# Patient Record
Sex: Male | Born: 1980 | Race: White | Hispanic: No | Marital: Married | Smoking: Former smoker
Health system: Southern US, Community
[De-identification: ages and names within clinical notes are randomized; demographics above are authoritative.]

## PROBLEM LIST (undated history)

## (undated) DIAGNOSIS — I209 Angina pectoris, unspecified: Secondary | ICD-10-CM

## (undated) DIAGNOSIS — I252 Old myocardial infarction: Secondary | ICD-10-CM

## (undated) DIAGNOSIS — I509 Heart failure, unspecified: Secondary | ICD-10-CM

---

## 2019-07-18 DIAGNOSIS — E872 Acidosis: Secondary | ICD-10-CM | POA: Diagnosis not present

## 2019-07-18 DIAGNOSIS — R079 Chest pain, unspecified: Secondary | ICD-10-CM | POA: Diagnosis not present

## 2019-07-18 DIAGNOSIS — I361 Nonrheumatic tricuspid (valve) insufficiency: Secondary | ICD-10-CM | POA: Diagnosis not present

## 2019-07-18 DIAGNOSIS — R109 Unspecified abdominal pain: Secondary | ICD-10-CM | POA: Diagnosis not present

## 2019-07-18 DIAGNOSIS — R748 Abnormal levels of other serum enzymes: Secondary | ICD-10-CM | POA: Diagnosis not present

## 2019-07-19 DIAGNOSIS — E781 Pure hyperglyceridemia: Secondary | ICD-10-CM | POA: Diagnosis not present

## 2019-07-19 DIAGNOSIS — F1010 Alcohol abuse, uncomplicated: Secondary | ICD-10-CM | POA: Diagnosis not present

## 2019-07-19 DIAGNOSIS — F17210 Nicotine dependence, cigarettes, uncomplicated: Secondary | ICD-10-CM | POA: Diagnosis not present

## 2019-07-19 DIAGNOSIS — R748 Abnormal levels of other serum enzymes: Secondary | ICD-10-CM | POA: Diagnosis not present

## 2019-07-19 DIAGNOSIS — E872 Acidosis: Secondary | ICD-10-CM | POA: Diagnosis not present

## 2019-07-19 DIAGNOSIS — R109 Unspecified abdominal pain: Secondary | ICD-10-CM | POA: Diagnosis not present

## 2019-07-19 DIAGNOSIS — I429 Cardiomyopathy, unspecified: Secondary | ICD-10-CM | POA: Diagnosis not present

## 2019-07-20 ENCOUNTER — Encounter (HOSPITAL_COMMUNITY): Payer: Self-pay | Admitting: Internal Medicine

## 2019-07-20 ENCOUNTER — Inpatient Hospital Stay (HOSPITAL_COMMUNITY)
Admission: AD | Admit: 2019-07-20 | Discharge: 2019-07-23 | DRG: 280 | Disposition: A | Discharge status: Home / Self Care | Payer: Commercial Managed Care - PPO | Source: Other Acute Inpatient Hospital | Attending: Internal Medicine | Admitting: Internal Medicine

## 2019-07-20 DIAGNOSIS — K859 Acute pancreatitis without necrosis or infection, unspecified: Secondary | ICD-10-CM | POA: Diagnosis present

## 2019-07-20 DIAGNOSIS — I429 Cardiomyopathy, unspecified: Secondary | ICD-10-CM

## 2019-07-20 DIAGNOSIS — I426 Alcoholic cardiomyopathy: Secondary | ICD-10-CM | POA: Diagnosis present

## 2019-07-20 DIAGNOSIS — E781 Pure hyperglyceridemia: Secondary | ICD-10-CM | POA: Diagnosis present

## 2019-07-20 DIAGNOSIS — I214 Non-ST elevation (NSTEMI) myocardial infarction: Secondary | ICD-10-CM | POA: Diagnosis present

## 2019-07-20 DIAGNOSIS — I428 Other cardiomyopathies: Secondary | ICD-10-CM | POA: Diagnosis not present

## 2019-07-20 DIAGNOSIS — F1020 Alcohol dependence, uncomplicated: Secondary | ICD-10-CM | POA: Diagnosis present

## 2019-07-20 DIAGNOSIS — F17210 Nicotine dependence, cigarettes, uncomplicated: Secondary | ICD-10-CM | POA: Diagnosis present

## 2019-07-20 DIAGNOSIS — R748 Abnormal levels of other serum enzymes: Secondary | ICD-10-CM

## 2019-07-20 DIAGNOSIS — Z8249 Family history of ischemic heart disease and other diseases of the circulatory system: Secondary | ICD-10-CM | POA: Diagnosis not present

## 2019-07-20 DIAGNOSIS — I2510 Atherosclerotic heart disease of native coronary artery without angina pectoris: Secondary | ICD-10-CM | POA: Diagnosis present

## 2019-07-20 DIAGNOSIS — I5021 Acute systolic (congestive) heart failure: Secondary | ICD-10-CM | POA: Diagnosis present

## 2019-07-20 DIAGNOSIS — R109 Unspecified abdominal pain: Secondary | ICD-10-CM

## 2019-07-20 DIAGNOSIS — E785 Hyperlipidemia, unspecified: Secondary | ICD-10-CM | POA: Diagnosis present

## 2019-07-20 DIAGNOSIS — I420 Dilated cardiomyopathy: Secondary | ICD-10-CM | POA: Diagnosis present

## 2019-07-20 DIAGNOSIS — E872 Acidosis: Secondary | ICD-10-CM | POA: Diagnosis not present

## 2019-07-20 DIAGNOSIS — R54 Age-related physical debility: Secondary | ICD-10-CM | POA: Diagnosis present

## 2019-07-20 DIAGNOSIS — I519 Heart disease, unspecified: Secondary | ICD-10-CM | POA: Diagnosis present

## 2019-07-20 DIAGNOSIS — Z20828 Contact with and (suspected) exposure to other viral communicable diseases: Secondary | ICD-10-CM | POA: Diagnosis present

## 2019-07-20 DIAGNOSIS — Z720 Tobacco use: Secondary | ICD-10-CM | POA: Diagnosis present

## 2019-07-20 DIAGNOSIS — R0602 Shortness of breath: Secondary | ICD-10-CM | POA: Diagnosis present

## 2019-07-20 DIAGNOSIS — I493 Ventricular premature depolarization: Secondary | ICD-10-CM | POA: Diagnosis present

## 2019-07-20 DIAGNOSIS — I5043 Acute on chronic combined systolic (congestive) and diastolic (congestive) heart failure: Secondary | ICD-10-CM | POA: Diagnosis not present

## 2019-07-20 HISTORY — DX: Angina pectoris, unspecified: I20.9

## 2019-07-20 LAB — CBC
HCT: 36.5 % — ABNORMAL LOW (ref 39.0–52.0)
Hemoglobin: 12.2 g/dL — ABNORMAL LOW (ref 13.0–17.0)
MCH: 33.2 pg (ref 26.0–34.0)
MCHC: 33.4 g/dL (ref 30.0–36.0)
MCV: 99.5 fL (ref 80.0–100.0)
Platelets: 126 10*3/uL — ABNORMAL LOW (ref 150–400)
RBC: 3.67 MIL/uL — ABNORMAL LOW (ref 4.22–5.81)
RDW: 14.6 % (ref 11.5–15.5)
WBC: 6 10*3/uL (ref 4.0–10.5)
nRBC: 0 % (ref 0.0–0.2)

## 2019-07-20 MED ORDER — SODIUM CHLORIDE 0.9% FLUSH
3.0000 mL | Freq: Two times a day (BID) | INTRAVENOUS | Status: DC
  Administered 2019-07-20 – 2019-07-22 (×3): 3 mL via INTRAVENOUS

## 2019-07-20 MED ORDER — LORAZEPAM 2 MG/ML IJ SOLN: 1.0000 mg | INTRAMUSCULAR | Status: DC | PRN

## 2019-07-20 MED ORDER — ACETAMINOPHEN 325 MG PO TABS
650.0000 mg | ORAL_TABLET | ORAL | Status: DC | PRN
  Administered 2019-07-21: 650 mg via ORAL
  Filled 2019-07-20: qty 2

## 2019-07-20 MED ORDER — THIAMINE HCL 100 MG/ML IJ SOLN
100.0000 mg | Freq: Every day | INTRAMUSCULAR | Status: DC
  Administered 2019-07-20: 100 mg via INTRAVENOUS
  Filled 2019-07-20: qty 2

## 2019-07-20 MED ORDER — SODIUM CHLORIDE 0.9% FLUSH: 3.0000 mL | INTRAVENOUS | Status: DC | PRN

## 2019-07-20 MED ORDER — ASPIRIN 81 MG PO CHEW
81.0000 mg | CHEWABLE_TABLET | ORAL | Status: AC
  Administered 2019-07-21: 81 mg via ORAL
  Filled 2019-07-20: qty 1

## 2019-07-20 MED ORDER — FOLIC ACID 1 MG PO TABS
1.0000 mg | ORAL_TABLET | Freq: Every day | ORAL | Status: DC
  Administered 2019-07-20 – 2019-07-23 (×4): 1 mg via ORAL
  Filled 2019-07-20 (×4): qty 1

## 2019-07-20 MED ORDER — ADULT MULTIVITAMIN W/MINERALS CH
1.0000 | ORAL_TABLET | Freq: Every day | ORAL | Status: DC
  Administered 2019-07-20 – 2019-07-23 (×4): 1 via ORAL
  Filled 2019-07-20 (×4): qty 1

## 2019-07-20 MED ORDER — SODIUM CHLORIDE 0.9 % IV SOLN
250.0000 mL | INTRAVENOUS | Status: DC | PRN
  Administered 2019-07-21: 250 mL via INTRAVENOUS

## 2019-07-20 MED ORDER — ONDANSETRON HCL 4 MG/2ML IJ SOLN: 4.0000 mg | Freq: Four times a day (QID) | INTRAMUSCULAR | Status: DC | PRN

## 2019-07-20 MED ORDER — NICOTINE 21 MG/24HR TD PT24
21.0000 mg | MEDICATED_PATCH | Freq: Every day | TRANSDERMAL | Status: DC
  Administered 2019-07-20 – 2019-07-23 (×4): 21 mg via TRANSDERMAL
  Filled 2019-07-20 (×4): qty 1

## 2019-07-20 MED ORDER — PNEUMOCOCCAL VAC POLYVALENT 25 MCG/0.5ML IJ INJ: 0.5000 mL | INJECTION | INTRAMUSCULAR | Status: DC

## 2019-07-20 MED ORDER — THIAMINE HCL 100 MG PO TABS
100.0000 mg | ORAL_TABLET | Freq: Every day | ORAL | Status: DC
  Administered 2019-07-21 – 2019-07-23 (×3): 100 mg via ORAL
  Filled 2019-07-20 (×4): qty 1

## 2019-07-20 MED ORDER — LORAZEPAM 1 MG PO TABS: 1.0000 mg | ORAL_TABLET | ORAL | Status: DC | PRN

## 2019-07-20 MED ORDER — HEPARIN (PORCINE) 25000 UT/250ML-% IV SOLN
1050.0000 [IU]/h | INTRAVENOUS | Status: DC
  Administered 2019-07-20: 850 [IU]/h via INTRAVENOUS
  Filled 2019-07-20: qty 250

## 2019-07-20 MED ORDER — SODIUM CHLORIDE 0.9 % IV SOLN: INTRAVENOUS | Status: DC

## 2019-07-20 NOTE — Consult Note (Addendum)
Advanced Heart Failure Team Consult Note   Primary Physician: Patient, No Pcp Per PCP-Cardiologist:  Dr. Geraldo Pitter T[facility] Alert)  Reason for Consultation: New Cardiomyopathy   HPI:    Richard Valenzuela is seen today for evaluation of new systolic HF/ cardiomyopathy at the request of Dr. Theresa Duty, Internal Medicine.   38 y/o male smoker w/ h/o heavy ETOH use.  Smokes a half a pack per day.  Drinks a pint of liquor on a daily basis.  Family history notable for premature coronary artery disease.  Father had quadruple bypass surgery at age 29.  His mother also has coronary artery disease and had multiple stents in her mid 51s.  No siblings.  He denies any other past medical history.  He initially presented to [facility] on 12/25 w/ complaints of acute pleuritic chest pain, left chest radiating to back and up left neck. Also endorsed abdominal pain. Worse in supine position and alleviated sitting up.  Also exacerbated by deep breathing. Recent history negative for cough, n/v/d, fever, orthopnea, PND, palpitations and syncope.  No recent viral syndrome.  Admit EKG at outside facility showed sinus tach 109 w/ PVCs.  Covid negative.  BNP elevated at 859. Troponin I negative x 3. CBC unremarkable. SCr 0.8, K 4.8. D-dimer 508. CT of chest unremarkable. Hepatic enzymes elevated. AST 221, ALT 95, Alk Phos 221. Total bilirubin 3.3 Pancreatic enzymes also elevated. Amylase 80. Lipase 258. Lipase further increased to 418. Triglycerides 4792. Pro calcitonin elevated at 4.48. Lactic acid 2.2. WBC WNL. Blood cultures at [facility] negative.  He was admitted by hospitalist for management of/ further work-up of acute pancreatitis.  MRCP showed no acute findings.  Given chest pain, he underwent further evaluation with cardiovascular imaging.  2D Echo showed severely dilated LV w/ severely impaired systolic function, EF 60-73%. RV normal in size and function.   Subsequent, Myocardial perfusion study demonstrated  extensive severe defect inferiorly w/ EF severely impaired at 15%. Seen by Dr. Ivin Booty cardiology and started on Entresto, spiro and low dose  blocker. Gemfibrozil initiated for hypertriglyceridemia. Transferred to [facility] for [facility] and AHF consultation.     On arrival to [facility], he is stable.  He continues to have mild pleuritic left-sided chest pain with deep breathing.  No associated dyspnea.  Admit CBC with hemoglobin of 12.3.  Serum creatinine 0.70.  Potassium 4.5.  Magnesium 1.8.  High-sensitivity troponin negative x2.  Direct LDL elevated at 259.8.  Hepatic enzymes improved.  AST 72, ALT 55, alk phos 150.  Total bilirubin 2.1.    Cardiac Studies 2D Echo [facility]) 07/18/19 Severely dilated LV, EF 25-30%. 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 No evidence of reversibility to suggest ischemia.  Severe global hypokinesis. Marked Left ventricular enlargement LVEF 15% Non invasive risk stratification High   Review of Systems: [y] = yes, '[ ]'$  = no   . General: Weight gain '[ ]'$ ; Weight loss '[ ]'$ ; Anorexia '[ ]'$ ; Fatigue '[ ]'$ ; Fever '[ ]'$ ; Chills '[ ]'$ ; Weakness '[ ]'$   . Cardiac: Chest pain/pressure '[ ]'$ ; Resting SOB '[ ]'$ ; Exertional SOB '[ ]'$ ; Orthopnea '[ ]'$ ; Pedal Edema '[ ]'$ ; Palpitations '[ ]'$ ; Syncope '[ ]'$ ; Presyncope '[ ]'$ ; Paroxysmal nocturnal dyspnea'[ ]'$   . Pulmonary: Cough '[ ]'$ ; Wheezing'[ ]'$ ; Hemoptysis'[ ]'$ ; Sputum '[ ]'$ ; Snoring '[ ]'$   .  GI: Vomiting'[ ]'$ ; Dysphagia'[ ]'$ ; Melena'[ ]'$ ; Hematochezia '[ ]'$ ; Heartburn'[ ]'$ ; Abdominal pain '[ ]'$ ; Constipation '[ ]'$ ; Diarrhea '[ ]'$ ; BRBPR '[ ]'$   . GU: Hematuria'[ ]'$ ; Dysuria '[ ]'$ ; Nocturia'[ ]'$   . Vascular: Pain in legs with walking '[ ]'$ ; Pain in feet with lying flat '[ ]'$ ; Non-healing sores '[ ]'$ ; Stroke '[ ]'$ ; TIA '[ ]'$ ; Slurred speech '[ ]'$ ;  . Neuro: Headaches'[ ]'$ ; Vertigo'[ ]'$ ;  Seizures'[ ]'$ ; Paresthesias'[ ]'$ ;Blurred vision '[ ]'$ ; Diplopia '[ ]'$ ; Vision changes '[ ]'$   . Ortho/Skin: Arthritis '[ ]'$ ; Joint pain '[ ]'$ ; Muscle pain '[ ]'$ ; Joint swelling '[ ]'$ ; Back Pain '[ ]'$ ; Rash '[ ]'$   . Psych: Depression'[ ]'$ ; Anxiety'[ ]'$   . Heme: Bleeding problems '[ ]'$ ; Clotting disorders '[ ]'$ ; Anemia '[ ]'$   . Endocrine: Diabetes '[ ]'$ ; Thyroid dysfunction'[ ]'$   Home Medications Prior to Admission medications   Not on File    Past Medical History: Past Medical History:  Diagnosis Date  . Anginal pain [facility])     Past Surgical History: History reviewed. No pertinent surgical history.  Family History: Family History  Problem Relation Age of Onset  . Coronary artery disease Mother 69       Multiple coronary stents  . Coronary artery disease Father 60       Quadruple bypass    Social History: Social History   Socioeconomic History  . Marital status: Married    Spouse name: Not on file  . Number of children: Not on file  . Years of education: Not on file  . Highest education level: Not on file  Occupational History  . Not on file  Tobacco Use  . Smoking status: Heavy Tobacco Smoker    Types: Cigarettes  . Smokeless tobacco: Never Used  Substance and Sexual Activity  . Alcohol use: Yes    Comment: 1 pint of liquor daily  . Drug use: Not on file  . Sexual activity: Not on file  Other Topics Concern  . Not on file  Social History Narrative  . Not on file   Social Determinants of Health   Financial Resource Strain:   . Difficulty of Paying Living Expenses: Not on file  Food Insecurity:   . Worried About Charity fundraiser in the Last Year: Not on file  . Ran Out of Food in the Last Year: Not on file  Transportation Needs:   . Lack of Transportation (Medical): Not on file  . Lack of Transportation (Non-Medical): Not on file  Physical Activity:   . Days of Exercise per Week: Not on file  . Minutes of Exercise per Session: Not on file  Stress:   . Feeling of Stress : Not on file    Social Connections:   . Frequency of Communication with Friends and Family: Not on file  . Frequency of Social Gatherings with Friends and Family: Not on file  . Attends Religious Services: Not on file  . Active Member of Clubs or Organizations: Not on file  . Attends Archivist Meetings: Not on file  . Marital Status: Not on file    Allergies:  Not on File  Objective:    Vital Signs:   Temp:  [97.8 F (36.6 C)-98.2 F (36.8 C)] 97.8 F (36.6 C) (12/29 0447) Pulse Rate:  [51-127] 95 (12/29 0447) Resp:  [18-20] 20 (12/29 0447) BP: (87-144)/(63-125) 87/66 (12/29 0447) SpO2:  [98 %-100 %] 98 % (12/29 0447) Weight:  [69 kg-69.9 kg] 69  kg (12/29 0447) Last BM Date: 07/20/19  Weight change: Filed Weights   07/20/19 1901 07/21/19 0447  Weight: 69.9 kg 69 kg    Intake/Output:   Intake/Output Summary (Last 24 hours) at 07/21/2019 0730 Last data filed at 07/21/2019 0600 Gross per 24 hour  Intake 189.8 ml  Output 700 ml  Net -510.2 ml      Physical Exam    General:  Well appearing, thin, young white male. No resp difficulty HEENT: normal Neck: supple. JVP . Carotids 2+ bilat; no bruits. No lymphadenopathy or thyromegaly appreciated. Cor: PMI nondisplaced. Regular rate & rhythm. No rubs, gallops or murmurs. Lungs: clear Abdomen: soft, nontender, nondistended. No hepatosplenomegaly. No bruits or masses. Good bowel sounds. Extremities: no cyanosis, clubbing, rash, edema Neuro: alert & orientedx3, cranial nerves grossly intact. moves all 4 extremities w/o difficulty. Affect pleasant   Telemetry   Normal sinus rhythm, 80s with occasional PVCs  EKG   Sinus tachycardia 109 bpm with occasional PVCs  Labs   Basic Metabolic Panel: Recent Labs  Lab 07/20/19 2208  NA 132*  K 4.5  CL 99  CO2 27  GLUCOSE 107*  BUN <5*  CREATININE 0.70  CALCIUM 8.4*  MG 1.8  PHOS 3.7    Liver Function Tests: Recent Labs  Lab 07/20/19 2208  AST 72*  ALT 55*   ALKPHOS 150*  BILITOT 2.1*  PROT 5.7*  ALBUMIN 2.7*   No results for input(s): LIPASE, AMYLASE in the last 168 hours. No results for input(s): AMMONIA in the last 168 hours.  CBC: Recent Labs  Lab 07/20/19 2208 07/21/19 0029  WBC 6.0 4.5  HGB 12.2* 12.3*  HCT 36.5* 35.9*  MCV 99.5 100.3*  PLT 126* 122*    Cardiac Enzymes: No results for input(s): CKTOTAL, CKMB, CKMBINDEX, TROPONINI in the last 168 hours.  BNP: BNP (last 3 results) No results for input(s): BNP in the last 8760 hours.  ProBNP (last 3 results) No results for input(s): PROBNP in the last 8760 hours.   CBG: No results for input(s): GLUCAP in the last 168 hours.  Coagulation Studies: No results for input(s): LABPROT, INR in the last 72 hours.   Imaging   No results found.   Medications:     Current Medications:    Infusions:      Patient Profile   Richard Valenzuela is a 38 year old male with personal history of heavy alcohol and tobacco use, along with family history of premature coronary artery disease in first-degree relatives, seen today for evaluation of new systolic HF/ cardiomyopathy at the request of Dr. Theresa Duty, Internal Medicine.   Assessment/Plan   1. Cardiomyopathy: New diagnosis.  Echocardiogram at [facility] 12/26 showed severely dilated LV with severe systolic dysfunction, EF 25 to 30% with diffuse hypokinesis.  RV normal in size and function.  No significant valvular disease.  He denies any recent viral syndromes.  Covid negative.  He has hyperlipidemia + FH of CAD however no other cardiac risk factors (no prior history of hypertension or diabetes).  Calculated LDL elevated at 259.  Potential etiologies include ischemic cardiomyopathy versus nonischemic causes (EtOH induced cardiomyopathy versus stress cardiomyopathy from acute pancreatitis).  -Plan right and left heart catheterization today to assess cardiac filling pressures, cardiac index and output as well as to rule out  coronary artery disease. -May need cMRI -We will initiate guideline directed medical therapy for systolic heart failure as BP allows (ARB/ARNi, MRA,  blocker)  -ETOH cessation imperative -Consider HIV screen  -Outpatient  sleep study to r/o OSA  2.  Pancreatitis: Likely secondary to alcohol abuse and hypertriglyceridemia.  Initial work-up at outside facility demonstrated elevated amylase, 80. Lipase 258. Lipase further increased to 418. Triglycerides 4792. Hepatic enzymes elevated. AST 221, ALT 95, Alk Phos 221. Total bilirubin 3.3. MRCP negative.  He is showing clinical signs of improvement. Pain improved. Hepatic enzymes downtrending AST 72, ALT 55, alk phos 150.  Total bilirubin 2.1. -Further management per primary team. -ETOH cessation imperative. -Will need medical management of his hypertriglyceridemia  3.  Alcohol abuse: Patient reports he drinks a pint of liquor on a daily basis. -CIWA protocol per primary team  4.  Tobacco abuse: Smokes a half a pack per day. -Continue nicotine patch  5. Hyperlipidemia:  - Direct LDL elevated at 259.  Triglycerides down to 625 (>1000 at outside facility) - recommend statin and consider Vascepa - needs to reduce ETOH intake  Length of Stay: 1  Brittainy Simmons, PA-C  07/21/2019, 7:30 AM  Advanced Heart Failure Team Pager [phone] (M-F; 7a - 4p)  Please contact [facility] for night-coverage after hours (4p -7a ) and weekends on [email or web address]  Patient seen with PA, agree with the above note.   He was admitted to [facility] with pleuritic chest pain and abdominal pain, lipase and amylase found to be elevated.  He is a heavy drinker and has very high triglycerides, either of which could cause pancreatitis.  With chest pain, echo was done showing EF down to 25-30%.  Cardiolite was done at [facility] and was abnormal.  He was transferred to [facility] for management.   RHC/LHC was done today:  Coronary Findings  Diagnostic Dominance: Right Left  Main  20% distal tapering.  Left Anterior Descending  Distal vessel narrowing without discrete stenosis. 40% ostial D1.  Ramus Intermedius  Moderate vessel with distal vessel tapering.  Left Circumflex  Large OM1 with luminal irregularities. Small AV LCx beyond OM1 with diffuse luminal irregularities.  Right Coronary Artery  Luminal irregularities.  Intervention  No interventions have been documented. Right Heart  Right Heart Pressures RHC Procedural Findings: Hemodynamics (mmHg) RA mean 1 RV 14/2 PA 14/2 PCWP mean 2 LV 76/0 AO 83/61  Oxygen saturations: PA 63% AO 99%  Cardiac Output (Fick) 4.13  Cardiac Index (Fick) 2.21   General: NAD Neck: No JVD, no thyromegaly or thyroid nodule.  Lungs: Clear to auscultation bilaterally with normal respiratory effort. CV: Nondisplaced PMI.  Heart regular S1/S2, no S3/S4, no murmur.  No peripheral edema.  No carotid bruit.  Normal pedal pulses.  Abdomen: Soft, mild epigastric tenderness, no hepatosplenomegaly, no distention.  Skin: Intact without lesions or rashes.  Neurologic: Alert and oriented x 3.  Psych: Normal affect. Extremities: No clubbing or cyanosis.  HEENT: Normal.   1. Acute systolic CHF: Echo at [facility] with EF 25-30%, normal RV.  Cardiolite showed EF 15% with fixed defect.  LHC/RHC today showed nonobstructive coronary disease and low filling pressures, CI 2.2.  Has had pleuritic chest pain possibly consistent with perimyocarditis (hs troponin not elevated). Finally, cardiomyopathy could be related to heavy ETOH use.  - Does not need Lasix, will actually give some fluid (NS 125 cc/hr x 10 hours post-cath) with very low filling pressures and soft BP, especially in setting of pancreatitis.  - No BP room to add cardiomyopathy meds at this time.  - Needs to stop ETOH.  - Cardiac MRI to look for infiltrative disease/evidence of perimyocarditis.  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  EF is down but hs-troponin is not elevated.  ECG is normal.  - For now, will treat with ASA 650 mg every 8 hrs (at least until pleuritic CP resolves).  - Reasonable to give course of colchicine.  - As above, would get cardiac MRI to look for evidence of perimyocarditis.  3. 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  FH of CAD, markedly high lipids, smoker.   - Will need ASA 81 long-term.  - Start Crestor 40 mg daily.  4. Hyperlipidemia: LDL 260, HDL 14, TGs 625 (but 4792 when checked at [facility]).  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  - Start Crestor 40 mg daily, may require PCSK9 inhibitor.  - Start Vascepa and fenofibrate, may need medication assistance for Vascepa.  5. Acute pancreatitis: Amylase and lipase elevated. ?Due to ETOH vs elevated triglycerides vs both. Pain improving.  Very dry on RHC today . - Hydrate as above.  - Per primary service.  6. Smoking: Needs to quit.  7. ETOH abuse: Needs to quit.   Loralie Champagne 07/21/2019 12:10 PM

## 2019-07-20 NOTE — Progress Notes (Signed)
ANTICOAGULATION CONSULT NOTE - Initial Consult  Pharmacy Consult for heparin Indication: chest pain/ACS  Not on File  Patient Measurements: Height: 5\' 10"  (177.8 cm) Weight: 154 lb 3.2 oz (69.9 kg) IBW/kg (Calculated) : 73 Heparin Dosing Weight: 69.9 kg  Vital Signs: Temp: 98.2 F (36.8 C) (12/28 1856) Temp Source: Oral (12/28 1856) BP: 98/63 (12/28 1901) Pulse Rate: 127 (12/28 1901)  Labs: No results for input(s): HGB, HCT, PLT, APTT, LABPROT, INR, HEPARINUNFRC, HEPRLOWMOCWT, CREATININE, CKTOTAL, CKMB, TROPONINIHS in the last 72 hours.  CrCl cannot be calculated (No successful lab value found.).   Medical History: No past medical history on file.  Medications:  Scheduled:  . [START ON 07/21/2019] pneumococcal 23 valent vaccine  0.5 mL Intramuscular Tomorrow-1000  . sodium chloride flush  3 mL Intravenous Q12H    Assessment: 38 yom transfer from [facility] presenting with pleuritic chest pain, abdominal pain, exertional dyspnea, and decreased exercise tolerance. ECHO finding LVEF 25-30%. No AC PTA.   Last received enoxaparin 40 mg for DVT ppx @1700  on 12/28 at OSH. No s/sx of bleeding.   Goal of Therapy:  Heparin level 0.3-0.7 units/ml Monitor platelets by anticoagulation protocol: Yes   Plan:  Given timing of last enox dose for DVT ppx will hold on bolus Start heparin infusion at 850 units/hr Check anti-Xa level in 6 hours and daily while on heparin Continue to monitor H&H and platelets  F/u plan for [facility] on 12/29  Antonietta Jewel, PharmD, BCCCP Clinical Pharmacist  Phone: [phone]  Please check AMION for all [place] phone numbers After 10:00 PM, call [place] [phone] 07/20/2019,8:11 PM

## 2019-07-20 NOTE — Progress Notes (Signed)
Patient has arrived to the unit. Tele placed and vitals taken. Patient oriented to room and safety instructions given. Will notify admitting MD of patients arrival and place COVID test per hospital policy for direct admitted patient and pending heart cath in am

## 2019-07-20 NOTE — Progress Notes (Addendum)
   Pt arrived from [facility]. Admission is to be done by hospitalist. Pt reports some mild left chest to left neck pain worse with deep breathing. He also has mild DOE. No edema. He appears very stable. He is laying almost flat with no difficulty. I discussed planned cardiac cath for tomorrow morning at 9:00 with the patient and his S/O. Also discussed that the advanced heart failure team with see him in the morning.  Physical Exam  Constitutional: He is oriented to person, place, and time. He appears well-developed and well-nourished. No distress.  HENT:  Head: Normocephalic and atraumatic.  Neck: No JVD present.  Cardiovascular: Normal rate, regular rhythm, normal heart sounds and intact distal pulses. Exam reveals no gallop and no friction rub.  No murmur heard. Pulmonary/Chest: Effort normal and breath sounds normal. No respiratory distress. He has no wheezes. He has no rales.  Abdominal: Soft. Bowel sounds are normal.  Musculoskeletal:        General: No edema. Normal range of motion.     Cervical back: Normal range of motion and neck supple.  Neurological: He is alert and oriented to person, place, and time.  Skin: Skin is warm and dry.  Psychiatric: He has a normal mood and affect. His behavior is normal. Judgment and thought content normal.   Daune Perch, AGNP-C [facility] 07/20/2019  7:37 PM

## 2019-07-20 NOTE — H&P (Signed)
History and Physical   Richard Valenzuela FMM:[id number] DOB: 1981/06/25 DOA: 07/20/2019  Referring MD/NP/PA: From [facility]  PCP: Patient, No Pcp Per   Outpatient Specialists: None  Patient coming from: [facility]  Chief Complaint: Shortness of breath and chest pain  HPI: Richard Valenzuela is a 38 y.o. 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  Patient was admitted on Christmas Day with chest pain.  Pain was intermittent.  No significant shortness of breath.  Patient was evaluated for possible PE.  COVID-19 was negative.  LFTs showed change including total bilirubin of 3.3 at the time.  Lactic acid was noted to be mildly elevated.  Procalcitonin also elevated.  As part of patient's work-up including chest pain work-up he had a Lexiscan and CT angiogram of the chest.  The Lexiscan showed previous MI with hypokinesis.  Echocardiogram showed EF of 25 to 30% and suspicion of possible secondary to alcohol but patient requires further evaluation.  He was subsequently transferred here for left and right heart catheterization.  He was also being treated for his pancreatitis with MRCP showing no acute findings.  He has hypertriglyceridemia with triglyceride more than 1000.  Patient reported significant alcohol abuse but no significant evidence of alcohol withdrawal.  He is also a heavy smoker who was initiated nicotine patch prior to transfer here.  At this point patient is to be treated for possible ischemic cardiomyopathy to be ruled out..  Review of Systems: As per HPI otherwise 10 point review of systems negative.    Past Medical History:  Diagnosis Date  . Anginal pain (HCC)     History reviewed. No pertinent surgical history.   reports that he has been smoking. He has never used smokeless  tobacco. He reports current alcohol use. No history on file for drug.  Not on File  History reviewed. No pertinent family history.   Prior to Admission medications   Not on File    Physical Exam: Vitals:   07/20/19 1856 07/20/19 1901  BP: (!) 136/125 98/63  Pulse: 62 (!) 127  Resp: 18   Temp: 98.2 F (36.8 C)   TempSrc: Oral   SpO2: 100% 100%  Weight:  69.9 kg  Height:  5\' 10"  (1.778 m)      Constitutional: Anxious, weak, chronically ill looking Vitals:   07/20/19 1856 07/20/19 1901  BP: (!) 136/125 98/63  Pulse: 62 (!) 127  Resp: 18   Temp: 98.2 F (36.8 C)   TempSrc: Oral   SpO2: 100% 100%  Weight:  69.9 kg  Height:  5\' 10"  (1.778 m)   Eyes: PERRL, lids and conjunctivae normal ENMT: Mucous membranes are moist. Posterior pharynx clear of any exudate or lesions.Normal dentition.  Neck: normal, supple, no masses, no thyromegaly Respiratory: clear to auscultation bilaterally, no wheezing, no crackles. Normal respiratory effort. No accessory muscle use.  Cardiovascular: Sinus tachycardia, no murmurs / rubs / gallops.  Trace pedal edema. 2+ pedal pulses. No carotid bruits.  Abdomen: Epigastric tenderness, no masses palpated. No hepatosplenomegaly. Bowel sounds positive.  Musculoskeletal: no clubbing / cyanosis. No joint deformity upper and lower extremities. Good ROM, no contractures. Normal muscle tone.  Skin: no rashes, lesions, ulcers. No induration Neurologic: CN 2-12 grossly intact. Sensation intact, DTR normal. Strength 5/5 in all 4.  Psychiatric: Normal judgment and insight. Alert and oriented  x 3. Normal mood.     Labs on Admission: I have personally reviewed following labs and imaging studies  CBC: Recent Labs  Lab 07/20/19 2208  WBC 6.0  HGB 12.2*  HCT 36.5*  MCV 99.5  PLT 126*   Basic Metabolic Panel: No results for input(s): NA, K, CL, CO2, GLUCOSE, BUN, CREATININE, CALCIUM, MG, PHOS in the last 168 hours. GFR: CrCl cannot be calculated (No  successful lab value found.). Liver Function Tests: No results for input(s): AST, ALT, ALKPHOS, BILITOT, PROT, ALBUMIN in the last 168 hours. No results for input(s): LIPASE, AMYLASE in the last 168 hours. No results for input(s): AMMONIA in the last 168 hours. Coagulation Profile: No results for input(s): INR, PROTIME in the last 168 hours. Cardiac Enzymes: No results for input(s): CKTOTAL, CKMB, CKMBINDEX, TROPONINI in the last 168 hours. BNP (last 3 results) No results for input(s): PROBNP in the last 8760 hours. HbA1C: No results for input(s): HGBA1C in the last 72 hours. CBG: No results for input(s): GLUCAP in the last 168 hours. Lipid Profile: No results for input(s): CHOL, HDL, LDLCALC, TRIG, CHOLHDL, LDLDIRECT in the last 72 hours. Thyroid Function Tests: No results for input(s): TSH, T4TOTAL, FREET4, T3FREE, THYROIDAB in the last 72 hours. Anemia Panel: No results for input(s): VITAMINB12, FOLATE, FERRITIN, TIBC, IRON, RETICCTPCT in the last 72 hours. Urine analysis: No results found for: COLORURINE, APPEARANCEUR, LABSPEC, PHURINE, GLUCOSEU, HGBUR, BILIRUBINUR, KETONESUR, PROTEINUR, UROBILINOGEN, NITRITE, LEUKOCYTESUR Sepsis Labs: @LABRCNTIP (procalcitonin:4,lacticidven:4) )No results found for this or any previous visit (from the past 240 hour(s)).   Radiological Exams on Admission: No results found.  EKG: Independently reviewed.  EKG showed normal sinus rhythm.  No significant ST changes  Assessment/Plan Principal Problem:   NSTEMI (non-ST elevated myocardial infarction) (HCC) Active Problems:   Systolic dysfunction   Alcoholic cardiomyopathy (HCC)   Tobacco abuse   Pancreatitis, acute   Hypertriglyceridemia     #1 possible non-ST elevation MI: Due to abnormal cardiac stress testing.  Enzymes have been negative.  EKG also within normal.  Patient transferred for left and right heart cath.  Hold keep n.p.o. after midnight.  On heparin drip.  Follow  cardiology.  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  EF of 20 to 30%.  Avoid IV fluids and monitor closely.  #3 alcohol abuse: No evidence of withdrawal.  We will proactively follow with CIWA protocol.  #4 tobacco abuse: Nicotine patch and counseling  #5 hypertriglyceridemia: Resume full treatment once p.o. intake resumes  #6 abdominal pain: Secondary to pancreatitis.  Improving.  Continue bowel rest.   DVT prophylaxis: Heparin Code Status: Full code Family Communication: No family at bedside Disposition Plan: To be determined Consults called: Cardiology Admission status: Inpatient  Severity of Illness: The appropriate patient status for this patient is INPATIENT. Inpatient status is judged to be reasonable and necessary in order to provide the required intensity of service to ensure the patient's safety. The patient's presenting symptoms, physical exam findings, and initial radiographic and laboratory data in the context of their chronic comorbidities is felt to place them at high risk for further clinical deterioration. Furthermore, it is not anticipated that the patient will be medically stable for discharge from the hospital within 2 midnights of admission. The following factors support the patient status of inpatient.   " The patient's presenting symptoms include chest pain. " The worrisome physical exam findings include frail with no problem. " The initial radiographic and laboratory data are worrisome because of abnormal  echocardiogram. " The chronic co-morbidities include alcohol abuse.   * I certify that at the point of admission it is my clinical judgment that the patient will require inpatient hospital care spanning beyond 2 midnights from the point of admission due to high intensity of service, high risk for further deterioration and high frequency of surveillance required.Barbette Merino MD [facility] Pager [phone]  If 7PM-7AM, please contact night-coverage [email or web address] Password [facility]  07/20/2019, 10:47 PM

## 2019-07-20 NOTE — H&P (View-Only) (Signed)
   [facility] Admission is to be done by hospitalist. Pt reports some mild left chest to left neck pain worse with deep breathing. He also has mild DOE. No edema. He appears very stable. He is laying almost flat with no difficulty. I discussed planned cardiac cath for tomorrow morning at 9:00 with the patient and his S/O. Also discussed that the advanced heart failure team with see him in the morning.  Physical Exam  Constitutional: He is oriented to person, place, and time. He appears well-developed and well-nourished. No distress.  HENT:  Head: Normocephalic and atraumatic.  Neck: No JVD present.  Cardiovascular: Normal rate, regular rhythm, normal heart sounds and intact distal pulses. Exam reveals no gallop and no friction rub.  No murmur heard. Pulmonary/Chest: Effort normal and breath sounds normal. No respiratory distress. He has no wheezes. He has no rales.  Abdominal: Soft. Bowel sounds are normal.  Musculoskeletal:        General: No edema. Normal range of motion.     Cervical back: Normal range of motion and neck supple.  Neurological: He is alert and oriented to person, place, and time.  Skin: Skin is warm and dry.  Psychiatric: He has a normal mood and affect. His behavior is normal. Judgment and thought content normal.   Malcom Selmer, AGNP-C [facility] 07/20/2019  7:37 PM   

## 2019-07-20 NOTE — Plan of Care (Signed)
  Problem: Education: Goal: Knowledge of General Education information will improve Description Including pain rating scale, medication(s)/side effects and non-pharmacologic comfort measures Outcome: Progressing   

## 2019-07-21 ENCOUNTER — Ambulatory Visit (HOSPITAL_COMMUNITY)
Admission: RE | Admit: 2019-07-21 | Payer: Commercial Managed Care - PPO | Source: Home / Self Care | Admitting: Cardiology

## 2019-07-21 ENCOUNTER — Encounter (HOSPITAL_COMMUNITY)
Admission: AD | Disposition: A | Discharge status: Home / Self Care | Payer: Self-pay | Source: Other Acute Inpatient Hospital | Attending: Internal Medicine

## 2019-07-21 ENCOUNTER — Encounter (HOSPITAL_COMMUNITY): Payer: Self-pay | Admitting: Internal Medicine

## 2019-07-21 ENCOUNTER — Other Ambulatory Visit: Payer: Self-pay

## 2019-07-21 DIAGNOSIS — I428 Other cardiomyopathies: Secondary | ICD-10-CM

## 2019-07-21 DIAGNOSIS — I5021 Acute systolic (congestive) heart failure: Secondary | ICD-10-CM

## 2019-07-21 DIAGNOSIS — I2510 Atherosclerotic heart disease of native coronary artery without angina pectoris: Secondary | ICD-10-CM

## 2019-07-21 HISTORY — PX: RIGHT/LEFT HEART CATH AND CORONARY ANGIOGRAPHY: CATH118266

## 2019-07-21 LAB — POCT I-STAT EG7
Acid-Base Excess: 2 mmol/L (ref 0.0–2.0)
Acid-Base Excess: 3 mmol/L — ABNORMAL HIGH (ref 0.0–2.0)
Bicarbonate: 27.9 mmol/L (ref 20.0–28.0)
Bicarbonate: 28.7 mmol/L — ABNORMAL HIGH (ref 20.0–28.0)
Calcium, Ion: 1.12 mmol/L — ABNORMAL LOW (ref 1.15–1.40)
Calcium, Ion: 1.22 mmol/L (ref 1.15–1.40)
HCT: 36 % — ABNORMAL LOW (ref 39.0–52.0)
HCT: 37 % — ABNORMAL LOW (ref 39.0–52.0)
Hemoglobin: 12.2 g/dL — ABNORMAL LOW (ref 13.0–17.0)
Hemoglobin: 12.6 g/dL — ABNORMAL LOW (ref 13.0–17.0)
O2 Saturation: 62 %
O2 Saturation: 64 %
Potassium: 3.8 mmol/L (ref 3.5–5.1)
Potassium: 4 mmol/L (ref 3.5–5.1)
Sodium: 134 mmol/L — ABNORMAL LOW (ref 135–145)
Sodium: 136 mmol/L (ref 135–145)
TCO2: 29 mmol/L (ref 22–32)
TCO2: 30 mmol/L (ref 22–32)
pCO2, Ven: 47 mmHg (ref 44.0–60.0)
pCO2, Ven: 48.6 mmHg (ref 44.0–60.0)
pH, Ven: 7.379 (ref 7.250–7.430)
pH, Ven: 7.382 (ref 7.250–7.430)
pO2, Ven: 33 mmHg (ref 32.0–45.0)
pO2, Ven: 34 mmHg (ref 32.0–45.0)

## 2019-07-21 LAB — SARS CORONAVIRUS 2 (TAT 6-24 HRS): SARS Coronavirus 2: NEGATIVE

## 2019-07-21 LAB — COMPREHENSIVE METABOLIC PANEL
ALT: 55 U/L — ABNORMAL HIGH (ref 0–44)
AST: 72 U/L — ABNORMAL HIGH (ref 15–41)
Albumin: 2.7 g/dL — ABNORMAL LOW (ref 3.5–5.0)
Alkaline Phosphatase: 150 U/L — ABNORMAL HIGH (ref 38–126)
Anion gap: 6 (ref 5–15)
BUN: <5 mg/dL — ABNORMAL LOW (ref 6–20)
CO2: 27 mmol/L (ref 22–32)
Calcium: 8.4 mg/dL — ABNORMAL LOW (ref 8.9–10.3)
Chloride: 99 mmol/L (ref 98–111)
Creatinine, Ser: 0.7 mg/dL (ref 0.61–1.24)
GFR calc Af Amer: >60 mL/min (ref >60)
GFR calc non Af Amer: >60 mL/min (ref >60)
Glucose, Bld: 107 mg/dL — ABNORMAL HIGH (ref 70–99)
Potassium: 4.5 mmol/L (ref 3.5–5.1)
Sodium: 132 mmol/L — ABNORMAL LOW (ref 135–145)
Total Bilirubin: 2.1 mg/dL — ABNORMAL HIGH (ref 0.3–1.2)
Total Protein: 5.7 g/dL — ABNORMAL LOW (ref 6.5–8.1)

## 2019-07-21 LAB — HIV ANTIBODY (ROUTINE TESTING W REFLEX): HIV Screen 4th Generation wRfx: NONREACTIVE

## 2019-07-21 LAB — HEPARIN LEVEL (UNFRACTIONATED)
Heparin Unfractionated: 0.15 IU/mL — ABNORMAL LOW (ref 0.30–0.70)
Heparin Unfractionated: 0.2 IU/mL — ABNORMAL LOW (ref 0.30–0.70)

## 2019-07-21 LAB — LIPID PANEL
Cholesterol: 484 mg/dL — ABNORMAL HIGH (ref 0–200)
HDL: 14 mg/dL — ABNORMAL LOW (ref >40)
LDL Cholesterol: UNDETERMINED mg/dL (ref 0–99)
Total CHOL/HDL Ratio: 34.6 RATIO
Triglycerides: 625 mg/dL — ABNORMAL HIGH (ref <150)
VLDL: UNDETERMINED mg/dL (ref 0–40)

## 2019-07-21 LAB — CBC
HCT: 35.9 % — ABNORMAL LOW (ref 39.0–52.0)
Hemoglobin: 12.3 g/dL — ABNORMAL LOW (ref 13.0–17.0)
MCH: 34.4 pg — ABNORMAL HIGH (ref 26.0–34.0)
MCHC: 34.3 g/dL (ref 30.0–36.0)
MCV: 100.3 fL — ABNORMAL HIGH (ref 80.0–100.0)
Platelets: 122 10*3/uL — ABNORMAL LOW (ref 150–400)
RBC: 3.58 MIL/uL — ABNORMAL LOW (ref 4.22–5.81)
RDW: 14.6 % (ref 11.5–15.5)
WBC: 4.5 10*3/uL (ref 4.0–10.5)
nRBC: 0 % (ref 0.0–0.2)

## 2019-07-21 LAB — MAGNESIUM: Magnesium: 1.8 mg/dL (ref 1.7–2.4)

## 2019-07-21 LAB — CREATININE, SERUM
Creatinine, Ser: 0.83 mg/dL (ref 0.61–1.24)
GFR calc Af Amer: >60 mL/min (ref >60)
GFR calc non Af Amer: >60 mL/min (ref >60)

## 2019-07-21 LAB — TROPONIN I (HIGH SENSITIVITY)
Troponin I (High Sensitivity): 11 ng/L (ref <18)
Troponin I (High Sensitivity): 13 ng/L (ref <18)

## 2019-07-21 LAB — PHOSPHORUS: Phosphorus: 3.7 mg/dL (ref 2.5–4.6)

## 2019-07-21 LAB — LDL CHOLESTEROL, DIRECT: Direct LDL: 259.8 mg/dL — ABNORMAL HIGH (ref 0–99)

## 2019-07-21 SURGERY — RIGHT/LEFT HEART CATH AND CORONARY ANGIOGRAPHY
Anesthesia: LOCAL

## 2019-07-21 MED ORDER — HYDRALAZINE HCL 20 MG/ML IJ SOLN: 10.0000 mg | INTRAMUSCULAR | Status: AC | PRN

## 2019-07-21 MED ORDER — ASPIRIN EC 325 MG PO TBEC
650.0000 mg | DELAYED_RELEASE_TABLET | Freq: Three times a day (TID) | ORAL | Status: DC
  Administered 2019-07-21 – 2019-07-22 (×3): 650 mg via ORAL
  Filled 2019-07-21 (×3): qty 2

## 2019-07-21 MED ORDER — ICOSAPENT ETHYL 1 G PO CAPS
2.0000 g | ORAL_CAPSULE | Freq: Two times a day (BID) | ORAL | Status: DC
  Administered 2019-07-21 – 2019-07-23 (×5): 2 g via ORAL
  Filled 2019-07-21 (×6): qty 2

## 2019-07-21 MED ORDER — HEPARIN SODIUM (PORCINE) 1000 UNIT/ML IJ SOLN
INTRAMUSCULAR | Status: AC
  Filled 2019-07-21: qty 1

## 2019-07-21 MED ORDER — SODIUM CHLORIDE 0.9% FLUSH
3.0000 mL | Freq: Two times a day (BID) | INTRAVENOUS | Status: DC
  Administered 2019-07-21 – 2019-07-23 (×5): 3 mL via INTRAVENOUS

## 2019-07-21 MED ORDER — ICOSAPENT ETHYL 1 G PO CAPS: 2.0000 g | ORAL_CAPSULE | Freq: Two times a day (BID) | ORAL | 2 refills | Status: DC

## 2019-07-21 MED ORDER — ROSUVASTATIN CALCIUM 20 MG PO TABS
40.0000 mg | ORAL_TABLET | Freq: Every day | ORAL | Status: DC
  Administered 2019-07-21 – 2019-07-22 (×2): 40 mg via ORAL
  Filled 2019-07-21 (×2): qty 2

## 2019-07-21 MED ORDER — FENOFIBRATE 160 MG PO TABS
160.0000 mg | ORAL_TABLET | Freq: Every day | ORAL | Status: DC
  Administered 2019-07-21 – 2019-07-23 (×3): 160 mg via ORAL
  Filled 2019-07-21 (×3): qty 1

## 2019-07-21 MED ORDER — ENOXAPARIN SODIUM 40 MG/0.4ML ~~LOC~~ SOLN
40.0000 mg | SUBCUTANEOUS | Status: DC
  Administered 2019-07-22 – 2019-07-23 (×2): 40 mg via SUBCUTANEOUS
  Filled 2019-07-21 (×2): qty 0.4

## 2019-07-21 MED ORDER — MIDAZOLAM HCL 2 MG/2ML IJ SOLN
INTRAMUSCULAR | Status: DC | PRN
  Administered 2019-07-21: 1 mg via INTRAVENOUS

## 2019-07-21 MED ORDER — ACETAMINOPHEN 325 MG PO TABS: 650.0000 mg | ORAL_TABLET | ORAL | Status: DC | PRN

## 2019-07-21 MED ORDER — NICOTINE 21 MG/24HR TD PT24: 21.0000 mg | MEDICATED_PATCH | Freq: Every day | TRANSDERMAL | 0 refills | Status: DC

## 2019-07-21 MED ORDER — SODIUM CHLORIDE 0.9% FLUSH
3.0000 mL | INTRAVENOUS | Status: DC | PRN
  Administered 2019-07-21: 3 mL via INTRAVENOUS

## 2019-07-21 MED ORDER — ADULT MULTIVITAMIN W/MINERALS CH: 1.0000 | ORAL_TABLET | Freq: Every day | ORAL | Status: AC

## 2019-07-21 MED ORDER — VERAPAMIL HCL 2.5 MG/ML IV SOLN
INTRAVENOUS | Status: AC
  Filled 2019-07-21: qty 2

## 2019-07-21 MED ORDER — HEPARIN (PORCINE) IN NACL 1000-0.9 UT/500ML-% IV SOLN
INTRAVENOUS | Status: AC
  Filled 2019-07-21: qty 1000

## 2019-07-21 MED ORDER — LIDOCAINE HCL (PF) 1 % IJ SOLN
INTRAMUSCULAR | Status: AC
  Filled 2019-07-21: qty 30

## 2019-07-21 MED ORDER — ROSUVASTATIN CALCIUM 40 MG PO TABS: 40.0000 mg | ORAL_TABLET | Freq: Every day | ORAL | 2 refills | Status: DC

## 2019-07-21 MED ORDER — SODIUM CHLORIDE 0.9% FLUSH
3.0000 mL | Freq: Two times a day (BID) | INTRAVENOUS | Status: DC
  Administered 2019-07-21 – 2019-07-22 (×3): 3 mL via INTRAVENOUS

## 2019-07-21 MED ORDER — SODIUM CHLORIDE 0.9 % IV SOLN: INTRAVENOUS | Status: AC

## 2019-07-21 MED ORDER — FENTANYL CITRATE (PF) 100 MCG/2ML IJ SOLN
INTRAMUSCULAR | Status: AC
  Filled 2019-07-21: qty 2

## 2019-07-21 MED ORDER — LABETALOL HCL 5 MG/ML IV SOLN: 10.0000 mg | INTRAVENOUS | Status: AC | PRN

## 2019-07-21 MED ORDER — ASPIRIN 81 MG PO CHEW
650.0000 mg | CHEWABLE_TABLET | Freq: Three times a day (TID) | ORAL | Status: DC
  Filled 2019-07-21: qty 9

## 2019-07-21 MED ORDER — HEPARIN SODIUM (PORCINE) 1000 UNIT/ML IJ SOLN
INTRAMUSCULAR | Status: DC | PRN
  Administered 2019-07-21: 3500 [IU] via INTRAVENOUS

## 2019-07-21 MED ORDER — MIDAZOLAM HCL 2 MG/2ML IJ SOLN
INTRAMUSCULAR | Status: AC
  Filled 2019-07-21: qty 2

## 2019-07-21 MED ORDER — SODIUM CHLORIDE 0.9 % IV SOLN: 250.0000 mL | INTRAVENOUS | Status: DC | PRN

## 2019-07-21 MED ORDER — FOLIC ACID 1 MG PO TABS: 1.0000 mg | ORAL_TABLET | Freq: Every day | ORAL | Status: DC

## 2019-07-21 MED ORDER — ONDANSETRON HCL 4 MG/2ML IJ SOLN: 4.0000 mg | Freq: Four times a day (QID) | INTRAMUSCULAR | Status: DC | PRN

## 2019-07-21 MED ORDER — PANTOPRAZOLE SODIUM 40 MG PO TBEC
40.0000 mg | DELAYED_RELEASE_TABLET | Freq: Every day | ORAL | Status: DC
  Administered 2019-07-21 – 2019-07-23 (×3): 40 mg via ORAL
  Filled 2019-07-21 (×3): qty 1

## 2019-07-21 MED ORDER — HEPARIN (PORCINE) IN NACL 1000-0.9 UT/500ML-% IV SOLN
INTRAVENOUS | Status: DC | PRN
  Administered 2019-07-21: 500 mL

## 2019-07-21 MED ORDER — COLCHICINE 0.6 MG PO TABS
0.6000 mg | ORAL_TABLET | Freq: Every day | ORAL | Status: DC
  Administered 2019-07-21 – 2019-07-23 (×3): 0.6 mg via ORAL
  Filled 2019-07-21 (×3): qty 1

## 2019-07-21 MED ORDER — THIAMINE HCL 100 MG PO TABS: 100.0000 mg | ORAL_TABLET | Freq: Every day | ORAL | Status: DC

## 2019-07-21 MED ORDER — IOHEXOL 350 MG/ML SOLN
INTRAVENOUS | Status: DC | PRN
  Administered 2019-07-21: 45 mL

## 2019-07-21 MED ORDER — FENTANYL CITRATE (PF) 100 MCG/2ML IJ SOLN
INTRAMUSCULAR | Status: DC | PRN
  Administered 2019-07-21: 25 ug via INTRAVENOUS

## 2019-07-21 MED ORDER — FENOFIBRATE 160 MG PO TABS: 160.0000 mg | ORAL_TABLET | Freq: Every day | ORAL | 2 refills | Status: DC

## 2019-07-21 MED ORDER — VERAPAMIL HCL 2.5 MG/ML IV SOLN
INTRAVENOUS | Status: DC | PRN
  Administered 2019-07-21: 10 mL via INTRA_ARTERIAL

## 2019-07-21 MED ORDER — LIDOCAINE HCL (PF) 1 % IJ SOLN
INTRAMUSCULAR | Status: DC | PRN
  Administered 2019-07-21 (×2): 2 mL

## 2019-07-21 MED ORDER — ASPIRIN 81 MG PO CHEW: 81.0000 mg | CHEWABLE_TABLET | Freq: Every day | ORAL | Status: DC

## 2019-07-21 SURGICAL SUPPLY — 11 items
CATH 5FR JL3.5 JR4 ANG PIG MP (CATHETERS) ×1 IMPLANT
CATH BALLN WEDGE 5F 110CM (CATHETERS) ×1 IMPLANT
DEVICE RAD COMP TR BAND LRG (VASCULAR PRODUCTS) ×1 IMPLANT
GLIDESHEATH SLEND SS 6F .021 (SHEATH) ×1 IMPLANT
GUIDEWIRE INQWIRE 1.5J.035X260 (WIRE) IMPLANT
INQWIRE 1.5J .035X260CM (WIRE) ×2
KIT HEART LEFT (KITS) ×2 IMPLANT
PACK CARDIAC CATHETERIZATION (CUSTOM PROCEDURE TRAY) ×2 IMPLANT
SHEATH GLIDE SLENDER 4/5FR (SHEATH) ×1 IMPLANT
SHEATH PROBE COVER 6X72 (BAG) ×1 IMPLANT
TRANSDUCER W/STOPCOCK (MISCELLANEOUS) ×2 IMPLANT

## 2019-07-21 NOTE — Progress Notes (Signed)
PROGRESS NOTE    Richard Valenzuela  DUK:[id number] DOB: 1980-10-07 DOA: 07/20/2019 PCP: Patient, No Pcp Per    Brief Narrative:38 y.o. male with medical history significant of alcohol abuse and tobacco abuse with concomitant diagnosis of alcoholic cardiomyopathy EF of 20 to 30% who was seen at [facility] initially with suspected acute pancreatitis with also hypertriglyceridemia.  Patient was admitted on Christmas Day with chest pain.  Pain was intermittent.  No significant shortness of breath.  Patient was evaluated for possible PE.  COVID-19 was negative.  LFTs showed change including total bilirubin of 3.3 at the time.  Lactic acid was noted to be mildly elevated.  Procalcitonin also elevated.  As part of patient's work-up including chest pain work-up he had a Lexiscan and CT angiogram of the chest.  The Lexiscan showed previous MI with hypokinesis.  Echocardiogram showed EF of 25 to 30% and suspicion of possible secondary to alcohol but patient requires further evaluation.  He was subsequently transferred here for left and right heart catheterization.  He was also being treated for his pancreatitis with MRCP showing no acute findings.  He has hypertriglyceridemia with triglyceride more than 1000.  Patient reported significant alcohol abuse but no significant evidence of alcohol withdrawal.  He is also a heavy smoker who was initiated nicotine patch prior to transfer here.  At this point patient is to be treated for possible ischemic cardiomyopathy to be ruled out..  Assessment & Plan:   Principal Problem:   NSTEMI (non-ST elevated myocardial infarction) (HCC) Active Problems:   Systolic dysfunction   Alcoholic cardiomyopathy (HCC)   Tobacco abuse   Pancreatitis, acute   Hypertriglyceridemia     #1 new onset cardiomyopathy-echo outside hospital shows ejection fraction 25 to 30% with diffuse hypokinesis.  Patient has history of severe alcohol abuse. Patient had complaints of pleuritic  chest pain question perimyocarditis has been started on colchicine. Covid negative.   Cath 07/21/2019 nonobstructive but advanced disease with low filling pressures. Cardiac MRI per cardiology to look for infiltrative disease and evidence of myocarditis and pericarditis. Patient started on aspirin 650 mg every 8 till pleuritic chest pain resolves. He is also started on colchicine.  #2 pancreatitis secondary to hypertriglyceridemia/alcohol abuse.  Will start IV fluids.  #3 hypertriglyceridemia/hyperlipidemia triglycerides 4792.  Elevated LDL on statin and Vascepa  #4 elevated LFTs improving likely secondary to #2  #5 alcohol abuse CIWA protocol counseled for cessation  #6 tobacco abuse continue nicotine patch   Estimated body mass index is 21.84 kg/m as calculated from the following:   Height as of this encounter: 5\' 10"  (1.778 m).   Weight as of this encounter: 69 kg.  DVT prophylaxis:  Code Status: Full code Family Communication: None  disposition Plan: Pending clinical improvement   Consultants:   Cardiology  Procedures: Cardiac cath 07/21/2019 Antimicrobials: None  Subjective: Patient resting in bed I saw him prior to cath  Objective: Vitals:   07/21/19 1017 07/21/19 1031 07/21/19 1046 07/21/19 1200  BP: 94/75 93/73 97/71  95/71  Pulse: (!) 55 93 92 87  Resp: 14     Temp: 98.1 F (36.7 C)     TempSrc: Oral     SpO2: 100% 99% 98% 96%  Weight:      Height:        Intake/Output Summary (Last 24 hours) at 07/21/2019 1259 Last data filed at 07/21/2019 1240 Gross per 24 hour  Intake 578.73 ml  Output 1300 ml  Net -721.27 ml   Ceasar Mons  Weights   07/20/19 1901 07/21/19 0447  Weight: 69.9 kg 69 kg    Examination:  General exam: Appears calm and comfortable  Respiratory system: Clear to auscultation. Respiratory effort normal. Cardiovascular system: S1 & S2 heard, RRR. No JVD, murmurs, rubs, gallops or clicks. No pedal edema. Gastrointestinal system: Abdomen  is nondistended, soft and nontender. No organomegaly or masses felt. Normal bowel sounds heard. Central nervous system: Alert and oriented. No focal neurological deficits. Extremities: Symmetric 5 x 5 power. Skin: No rashes, lesions or ulcers Psychiatry: Judgement and insight appear normal. Mood & affect appropriate.     Data Reviewed: I have personally reviewed following labs and imaging studies  CBC: Recent Labs  Lab 07/20/19 2208 07/21/19 0029  WBC 6.0 4.5  HGB 12.2* 12.3*  HCT 36.5* 35.9*  MCV 99.5 100.3*  PLT 126* 122*   Basic Metabolic Panel: Recent Labs  Lab 07/20/19 2208  NA 132*  K 4.5  CL 99  CO2 27  GLUCOSE 107*  BUN <5*  CREATININE 0.70  CALCIUM 8.4*  MG 1.8  PHOS 3.7   GFR: Estimated Creatinine Clearance: 122.2 mL/min (by C-G formula based on SCr of 0.7 mg/dL). Liver Function Tests: Recent Labs  Lab 07/20/19 2208  AST 72*  ALT 55*  ALKPHOS 150*  BILITOT 2.1*  PROT 5.7*  ALBUMIN 2.7*   No results for input(s): LIPASE, AMYLASE in the last 168 hours. No results for input(s): AMMONIA in the last 168 hours. Coagulation Profile: No results for input(s): INR, PROTIME in the last 168 hours. Cardiac Enzymes: No results for input(s): CKTOTAL, CKMB, CKMBINDEX, TROPONINI in the last 168 hours. BNP (last 3 results) No results for input(s): PROBNP in the last 8760 hours. HbA1C: No results for input(s): HGBA1C in the last 72 hours. CBG: No results for input(s): GLUCAP in the last 168 hours. Lipid Profile: Recent Labs    07/20/19 2208  CHOL 484*  HDL 14*  LDLCALC UNABLE TO CALCULATE IF TRIGLYCERIDE OVER 400 mg/dL  TRIG 007*  CHOLHDL 62.2  LDLDIRECT 259.8*   Thyroid Function Tests: No results for input(s): TSH, T4TOTAL, FREET4, T3FREE, THYROIDAB in the last 72 hours. Anemia Panel: No results for input(s): VITAMINB12, FOLATE, FERRITIN, TIBC, IRON, RETICCTPCT in the last 72 hours. Sepsis Labs: No results for input(s): PROCALCITON, LATICACIDVEN in  the last 168 hours.  Recent Results (from the past 240 hour(s))  SARS CORONAVIRUS 2 (TAT 6-24 HRS) Nasopharyngeal Nasopharyngeal Swab     Status: None   Collection Time: 07/20/19  9:48 PM   Specimen: Nasopharyngeal Swab  Result Value Ref Range Status   SARS Coronavirus 2 NEGATIVE NEGATIVE Final    Comment: (NOTE) SARS-CoV-2 target nucleic acids are NOT DETECTED. The SARS-CoV-2 RNA is generally detectable in upper and lower respiratory specimens during the acute phase of infection. Negative results do not preclude SARS-CoV-2 infection, do not rule out co-infections with other pathogens, and should not be used as the sole basis for treatment or other patient management decisions. Negative results must be combined with clinical observations, patient history, and epidemiological information. The expected result is Negative. Fact Sheet for Patients: HairSlick.no Fact Sheet for Healthcare Providers: quierodirigir.com This test is not yet approved or cleared by the [place] FDA and  has been authorized for detection and/or diagnosis of SARS-CoV-2 by FDA under an Emergency Use Authorization (EUA). This EUA will remain  in effect (meaning this test can be used) for the duration of the COVID-19 declaration under Section 56 4(b)(1) of the  Act, 21 U.S.C. section 360bbb-3(b)(1), unless the authorization is terminated or revoked sooner. Performed at [facility], [place] [address], [place],  [address]          Radiology Studies: CARDIAC CATHETERIZATION  Result Date: 07/21/2019 1. Low filling pressures. 2. Relatively preserved cardiac output. 3. 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        Scheduled Meds: . aspirin  650 mg Oral Q8H  . colchicine  0.6 mg Oral Daily  . [START ON 07/22/2019] enoxaparin (LOVENOX) injection  40 mg Subcutaneous Q24H  . fenofibrate  160 mg Oral Daily  .  folic acid  1 mg Oral Daily  . icosapent Ethyl  2 g Oral BID  . multivitamin with minerals  1 tablet Oral Daily  . nicotine  21 mg Transdermal Daily  . pantoprazole  40 mg Oral Daily  . pneumococcal 23 valent vaccine  0.5 mL Intramuscular Tomorrow-1000  . rosuvastatin  40 mg Oral q1800  . sodium chloride flush  3 mL Intravenous Q12H  . sodium chloride flush  3 mL Intravenous Q12H  . sodium chloride flush  3 mL Intravenous Q12H  . thiamine  100 mg Oral Daily   Or  . thiamine  100 mg Intravenous Daily   Continuous Infusions: . sodium chloride 125 mL/hr at 07/21/19 1101  . sodium chloride       LOS: 1 day     Georgette Shell, MD [facility]   If 7PM-7AM, please contact night-coverage [email or web address] Password [facility] 07/21/2019, 12:59 PM

## 2019-07-21 NOTE — Progress Notes (Signed)
ANTICOAGULATION CONSULT NOTE  Pharmacy Consult for heparin Indication: chest pain/ACS  Not on File  Patient Measurements: Height: 5\' 10"  (177.8 cm) Weight: 152 lb 3.2 oz (69 kg) IBW/kg (Calculated) : 73 Heparin Dosing Weight: 69.9 kg  Vital Signs: Temp: 97.8 F (36.6 C) (12/29 0447) Temp Source: Oral (12/29 0447) BP: 87/66 (12/29 0447) Pulse Rate: 95 (12/29 0447)  Labs: Recent Labs    07/20/19 2208 07/20/19 2328 07/21/19 0029 07/21/19 0542  HGB 12.2*  --  12.3*  --   HCT 36.5*  --  35.9*  --   PLT 126*  --  122*  --   HEPARINUNFRC  --   --  0.20* 0.15*  CREATININE 0.70  --   --   --   TROPONINIHS 11 13  --   --     Estimated Creatinine Clearance: 122.2 mL/min (by C-G formula based on SCr of 0.7 mg/dL).   Assessment: 54 yom transfer from [facility] presenting with pleuritic chest pain, abdominal pain, exertional dyspnea, and decreased exercise tolerance. ECHO finding LVEF 25-30%. No AC PTA.   Heparin level subtherapeutic (0.15) on gtt at 850 units/hr. No issues with line or bleeding reported per RN.  Goal of Therapy:  Heparin level 0.3-0.7 units/ml Monitor platelets by anticoagulation protocol: Yes   Plan:  Increase heparin to 1050 units/hr Will f/u post cath  Sherlon Handing, PharmD, BCPS Please see amion for complete clinical pharmacist phone list 07/21/2019,6:58 AM

## 2019-07-21 NOTE — Progress Notes (Signed)
ANTICOAGULATION CONSULT NOTE  Pharmacy Consult for heparin Indication: chest pain/ACS  Not on File  Patient Measurements: Height: 5\' 10"  (177.8 cm) Weight: 154 lb 3.2 oz (69.9 kg) IBW/kg (Calculated) : 73 Heparin Dosing Weight: 69.9 kg  Vital Signs: Temp: 98.2 F (36.8 C) (12/28 1856) Temp Source: Oral (12/28 1856) BP: 98/63 (12/28 1901) Pulse Rate: 127 (12/28 1901)  Labs: Recent Labs    07/20/19 2208 07/20/19 2328 07/21/19 0029  HGB 12.2*  --  12.3*  HCT 36.5*  --  35.9*  PLT 126*  --  122*  HEPARINUNFRC  --   --  0.20*  CREATININE 0.70  --   --   TROPONINIHS 11 13  --     Estimated Creatinine Clearance: 123.8 mL/min (by C-G formula based on SCr of 0.7 mg/dL).   Assessment: 35 yom transfer from [facility] presenting with pleuritic chest pain, abdominal pain, exertional dyspnea, and decreased exercise tolerance. ECHO finding LVEF 25-30%. No AC PTA.   Last received enoxaparin 40 mg for DVT ppx @1700  on 12/28 at OSH. No s/sx of bleeding.  Heparin level subtherapeutic (0.2) on gtt at 850 units/hr however level drawn only ~2 hours post gtt start so likely not accurate.   Goal of Therapy:  Heparin level 0.3-0.7 units/ml Monitor platelets by anticoagulation protocol: Yes   Plan:  Continue heparin gtt at 850 units/hr and f/u 0500 heparin level  Sherlon Handing, PharmD, BCPS Please see amion for complete clinical pharmacist phone list 07/21/2019,1:10 AM

## 2019-07-21 NOTE — Interval H&P Note (Signed)
History and Physical Interval Note:  07/21/2019 9:29 AM  Richard Valenzuela  has presented today for surgery, with the diagnosis of cardiomyopathy - abnormal stress test.  The various methods of treatment have been discussed with the patient and family. After consideration of risks, benefits and other options for treatment, the patient has consented to  Procedure(s): RIGHT/LEFT HEART CATH AND CORONARY ANGIOGRAPHY (N/A) as a surgical intervention.  The patient's history has been reviewed, patient examined, no change in status, stable for surgery.  I have reviewed the patient's chart and labs.  Questions were answered to the patient's satisfaction.     Blaise Grieshaber [employer]

## 2019-07-22 ENCOUNTER — Inpatient Hospital Stay (HOSPITAL_COMMUNITY): Payer: Commercial Managed Care - PPO

## 2019-07-22 DIAGNOSIS — I426 Alcoholic cardiomyopathy: Secondary | ICD-10-CM

## 2019-07-22 DIAGNOSIS — I5043 Acute on chronic combined systolic (congestive) and diastolic (congestive) heart failure: Secondary | ICD-10-CM

## 2019-07-22 LAB — COMPREHENSIVE METABOLIC PANEL
ALT: 42 U/L (ref 0–44)
AST: 50 U/L — ABNORMAL HIGH (ref 15–41)
Albumin: 2.6 g/dL — ABNORMAL LOW (ref 3.5–5.0)
Alkaline Phosphatase: 116 U/L (ref 38–126)
Anion gap: 8 (ref 5–15)
BUN: 6 mg/dL (ref 6–20)
CO2: 26 mmol/L (ref 22–32)
Calcium: 8.5 mg/dL — ABNORMAL LOW (ref 8.9–10.3)
Chloride: 103 mmol/L (ref 98–111)
Creatinine, Ser: 0.87 mg/dL (ref 0.61–1.24)
GFR calc Af Amer: >60 mL/min (ref >60)
GFR calc non Af Amer: >60 mL/min (ref >60)
Glucose, Bld: 94 mg/dL (ref 70–99)
Potassium: 4.1 mmol/L (ref 3.5–5.1)
Sodium: 137 mmol/L (ref 135–145)
Total Bilirubin: 1.3 mg/dL — ABNORMAL HIGH (ref 0.3–1.2)
Total Protein: 5.4 g/dL — ABNORMAL LOW (ref 6.5–8.1)

## 2019-07-22 LAB — CBC WITH DIFFERENTIAL/PLATELET
Abs Immature Granulocytes: 0 10*3/uL (ref 0.00–0.07)
Basophils Absolute: 0 10*3/uL (ref 0.0–0.1)
Basophils Relative: 1 %
Eosinophils Absolute: 0.1 10*3/uL (ref 0.0–0.5)
Eosinophils Relative: 2 %
HCT: 34.7 % — ABNORMAL LOW (ref 39.0–52.0)
Hemoglobin: 11.4 g/dL — ABNORMAL LOW (ref 13.0–17.0)
Lymphocytes Relative: 22 %
Lymphs Abs: 0.8 10*3/uL (ref 0.7–4.0)
MCH: 33.7 pg (ref 26.0–34.0)
MCHC: 32.9 g/dL (ref 30.0–36.0)
MCV: 102.7 fL — ABNORMAL HIGH (ref 80.0–100.0)
Metamyelocytes Relative: 1 %
Monocytes Absolute: 0.3 10*3/uL (ref 0.1–1.0)
Monocytes Relative: 8 %
Neutro Abs: 2.4 10*3/uL (ref 1.7–7.7)
Neutrophils Relative %: 66 %
Platelets: 132 10*3/uL — ABNORMAL LOW (ref 150–400)
RBC: 3.38 MIL/uL — ABNORMAL LOW (ref 4.22–5.81)
RDW: 15.1 % (ref 11.5–15.5)
WBC: 3.7 10*3/uL — ABNORMAL LOW (ref 4.0–10.5)
nRBC: 0 % (ref 0.0–0.2)
nRBC: 0 /100 WBC

## 2019-07-22 LAB — LIPASE, BLOOD: Lipase: 39 U/L (ref 11–51)

## 2019-07-22 LAB — MAGNESIUM: Magnesium: 1.9 mg/dL (ref 1.7–2.4)

## 2019-07-22 MED ORDER — GADOBUTROL 1 MMOL/ML IV SOLN
7.0000 mL | Freq: Once | INTRAVENOUS | Status: AC | PRN
  Administered 2019-07-22: 7 mL via INTRAVENOUS

## 2019-07-22 MED ORDER — LOSARTAN POTASSIUM 25 MG PO TABS
12.5000 mg | ORAL_TABLET | Freq: Every day | ORAL | Status: DC
  Administered 2019-07-22: 12.5 mg via ORAL
  Filled 2019-07-22: qty 1

## 2019-07-22 MED ORDER — DIGOXIN 125 MCG PO TABS
0.1250 mg | ORAL_TABLET | Freq: Every day | ORAL | Status: DC
  Administered 2019-07-22 – 2019-07-23 (×2): 0.125 mg via ORAL
  Filled 2019-07-22 (×2): qty 1

## 2019-07-22 MED ORDER — ASPIRIN 81 MG PO CHEW
81.0000 mg | CHEWABLE_TABLET | Freq: Once | ORAL | Status: AC
  Administered 2019-07-22: 81 mg via ORAL
  Filled 2019-07-22: qty 1

## 2019-07-22 MED ORDER — SPIRONOLACTONE 12.5 MG HALF TABLET
12.5000 mg | ORAL_TABLET | Freq: Every day | ORAL | Status: DC
  Administered 2019-07-22 – 2019-07-23 (×2): 12.5 mg via ORAL
  Filled 2019-07-22 (×2): qty 1

## 2019-07-22 NOTE — Progress Notes (Addendum)
Advanced Heart Failure Rounding Note  PCP-Cardiologist: No primary care provider on file.   Subjective:    New CM. EF 25-30%. Nonischemic   RHC/LHC 07/21/19   Coronary Findings  Diagnostic Dominance: Right Left Main  20% distal tapering.  Left Anterior Descending  Distal vessel narrowing without discrete stenosis. 40% ostial D1.  Ramus Intermedius  Moderate vessel with distal vessel tapering.  Left Circumflex  Large OM1 with luminal irregularities. Small AV LCx beyond OM1 with diffuse luminal irregularities.  Right Coronary Artery  Luminal irregularities.  Intervention  No interventions have been documented. Right Heart  Right Heart Pressures RHC Procedural Findings: Hemodynamics (mmHg) RA mean 1 RV 14/2 PA 14/2 PCWP mean 2 LV 76/0 AO 83/61  Oxygen saturations: PA 63% AO 99%  Cardiac Output (Fick) 4.13  Cardiac Index (Fick) 2.21    Feels better today. Pleuritic CP resolved. No dyspnea. Denies feeling lightheaded or dizzy. Radial cath site stable. 2+ pulse.    Objective:   Weight Range: 68.3 kg Body mass index is 21.61 kg/m.   Vital Signs:   Temp:  [97.6 F (36.4 C)-98.4 F (36.9 C)] 98.4 F (36.9 C) (12/30 1123) Pulse Rate:  [79-108] 95 (12/30 1123) Resp:  [14-20] 18 (12/30 1123) BP: (86-117)/(61-76) 112/74 (12/30 1123) SpO2:  [97 %-100 %] 99 % (12/30 1123) Weight:  [68.3 kg] 68.3 kg (12/30 0457) Last BM Date: 07/20/19  Weight change: Filed Weights   07/20/19 1901 07/21/19 0447 07/22/19 0457  Weight: 69.9 kg 69 kg 68.3 kg    Intake/Output:   Intake/Output Summary (Last 24 hours) at 07/22/2019 1403 Last data filed at 07/22/2019 1252 Gross per 24 hour  Intake 2545.06 ml  Output 1750 ml  Net 795.06 ml      Physical Exam    General:  Well appearing young WM. No resp difficulty HEENT: Normal Neck: Supple. JVP . Carotids 2+ bilat; no bruits. No lymphadenopathy or thyromegaly appreciated. Cor: PMI nondisplaced. Regular rate &  rhythm. No rubs, gallops or murmurs. Lungs: Clear Abdomen: Soft, nontender, nondistended. No hepatosplenomegaly. No bruits or masses. Good bowel sounds. Extremities: No cyanosis, clubbing, rash, edema Neuro: Alert & orientedx3, cranial nerves grossly intact. moves all 4 extremities w/o difficulty. Affect pleasant   Telemetry   NSR 90s.   EKG    N/A  Labs    CBC Recent Labs    07/21/19 0029 07/21/19 0944 07/22/19 0537  WBC 4.5  --  3.7*  NEUTROABS  --   --  2.4  HGB 12.3* 12.6*  12.2* 11.4*  HCT 35.9* 37.0*  36.0* 34.7*  MCV 100.3*  --  102.7*  PLT 122*  --  132*   Basic Metabolic Panel Recent Labs    61/53/79 2208 07/21/19 0542 07/21/19 0944 07/22/19 0537  NA 132*  --  134*  136 137  K 4.5  --  4.0  3.8 4.1  CL 99  --   --  103  CO2 27  --   --  26  GLUCOSE 107*  --   --  94  BUN <5*  --   --  6  CREATININE 0.70 0.83  --  0.87  CALCIUM 8.4*  --   --  8.5*  MG 1.8  --   --  1.9  PHOS 3.7  --   --   --    Liver Function Tests Recent Labs    07/20/19 2208 07/22/19 0537  AST 72* 50*  ALT 55* 42  ALKPHOS  150* 116  BILITOT 2.1* 1.3*  PROT 5.7* 5.4*  ALBUMIN 2.7* 2.6*   Recent Labs    07/22/19 0537  LIPASE 39   Cardiac Enzymes No results for input(s): CKTOTAL, CKMB, CKMBINDEX, TROPONINI in the last 72 hours.  BNP: BNP (last 3 results) No results for input(s): BNP in the last 8760 hours.  ProBNP (last 3 results) No results for input(s): PROBNP in the last 8760 hours.   D-Dimer No results for input(s): DDIMER in the last 72 hours. Hemoglobin A1C No results for input(s): HGBA1C in the last 72 hours. Fasting Lipid Panel Recent Labs    07/20/19 2208  CHOL 484*  HDL 14*  LDLCALC UNABLE TO CALCULATE IF TRIGLYCERIDE OVER 400 mg/dL  TRIG 625*  CHOLHDL 34.6  LDLDIRECT 259.8*   Thyroid Function Tests No results for input(s): TSH, T4TOTAL, T3FREE, THYROIDAB in the last 72 hours.  Invalid input(s): FREET3  Other results:   Imaging      No results found.   Medications:     Scheduled Medications: . aspirin EC  650 mg Oral Q8H  . colchicine  0.6 mg Oral Daily  . enoxaparin (LOVENOX) injection  40 mg Subcutaneous Q24H  . fenofibrate  160 mg Oral Daily  . folic acid  1 mg Oral Daily  . icosapent Ethyl  2 g Oral BID  . multivitamin with minerals  1 tablet Oral Daily  . nicotine  21 mg Transdermal Daily  . pantoprazole  40 mg Oral Daily  . pneumococcal 23 valent vaccine  0.5 mL Intramuscular Tomorrow-1000  . rosuvastatin  40 mg Oral q1800  . sodium chloride flush  3 mL Intravenous Q12H  . sodium chloride flush  3 mL Intravenous Q12H  . sodium chloride flush  3 mL Intravenous Q12H  . thiamine  100 mg Oral Daily   Or  . thiamine  100 mg Intravenous Daily     Infusions: . sodium chloride       PRN Medications:  sodium chloride, acetaminophen, LORazepam **OR** LORazepam, ondansetron (ZOFRAN) IV, sodium chloride flush    Patient Profile   Richard Valenzuela is a 38 year old male with personal history of heavy alcohol and tobacco use, along with family history of premature coronary artery disease in first-degree relatives, seen today for evaluation of new systolic HF/ cardiomyopathy, EF 25-20%. NICM. 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   Assessment/Plan    1. Acute systolic CHF: Echo at [facility] with EF 25-30%, normal RV.  Cardiolite showed EF 15% with fixed defect.  LHC/RHC 12/29 showed nonobstructive coronary disease and low filling pressures, CI 2.2.  Has had pleuritic chest pain possibly consistent with perimyocarditis (hs troponin not elevated). Finally, cardiomyopathy could be related to heavy ETOH use.  - Diuretics held. Was given IVFs (NS 125 cc/hr x 10 hours post-cath) for very low filling pressures and soft BP, in setting of pancreatitis.  - add losartan 12.5 qhs - add spiro 12.5 mg daily  - Needs to stop ETOH.  - Cardiac MRI performed today to look for infiltrative disease/evidence of perimyocarditis.  Results pending.  2. Perimyocarditis: Presented w/ pleuritic and positional chest pain, hard to know if this is all from pancreatitis or if he has a component of perimyocarditis.  EF is down but hs-troponin is not elevated.  ECG is normal.  - Pain improved. Will reduce ASA to 81 mg daily. cont colchicine.  - As above, would get cardiac MRI to look for evidence of perimyocarditis. Test completed. Results pending. 3. CAD:  Nonobstructive but age-advanced CAD.  FH of CAD, markedly high lipids, smoker.   - Will need ASA 81 long-term.  - Started on Crestor 40 mg daily.  4. Hyperlipidemia: LDL 260, HDL 14, TGs 625 (but 4792 when checked at [facility]).  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  - Started on Crestor 40 mg daily, may require PCSK9 inhibitor.  - Started Vascepa and fenofibrate, may need medication assistance for Vascepa. Will consult CM.  - Needs repeat FLP and HFTs in 6-8 wks 5. Acute pancreatitis: Amylase and lipase elevated. ?Due to ETOH vs elevated triglycerides vs both. Pain resolved.  Very dry on RHC yesterday . - received IVFs.  - further management Per primary service.  6. Smoking: Needs to quit.  7. ETOH abuse: Needs to quit.   Length of Stay: 2  Robbie Lis, PA-C  07/22/2019, 2:03 PM  Advanced Heart Failure Team Pager [phone] (M-F; 7a - 4p)  Please contact [facility] for night-coverage after hours (4p -7a ) and weekends on [email or web address]  Patient seen with PA, agree with the above note.   Pleuritic chest pain and abdominal pain seem to have resolved.  Lipase back to normal range.  SBP 110s.   - Will start spironolactone 12.5 daily and losartan 12.5 daily.  - Cardiac MRI done today to look for myopericarditis, will review.  - Will continue colchicine, can stop high dose ASA at this point and will use ASA 81 daily.   Management of suspected familial dyslipidemia as above.   Marca Ancona 07/22/2019 5:01 PM

## 2019-07-22 NOTE — Progress Notes (Signed)
Patient requesting solid food for lunch and to shower. Richard Daniel, MD paged. Verbal orders received to discontinue clear liquid diet and resume cardiac diet, patient allowed to shower with assistance. Nurse will carry out orders.

## 2019-07-22 NOTE — Progress Notes (Addendum)
PROGRESS NOTE    Richard Valenzuela  JSH:[id number] DOB: [birth year] DOA: 07/20/2019 PCP: Patient, No Pcp Per  Brief Narrative:38 y.o.malewith medical history significant ofalcohol abuse and tobacco abuse with concomitant diagnosis of alcoholic cardiomyopathy EF of 20 to 30% who was seen at [facility] initially with suspected acute pancreatitis with also hypertriglyceridemia. Patient was admitted on Christmas Day with chest pain. Pain was intermittent. No significant shortness of breath. Patient was evaluated for possible PE. COVID-19 was negative. LFTs showed change including total bilirubin of 3.3 at the time. Lactic acid was noted to be mildly elevated. Procalcitonin also elevated. As part of patient's work-up including chest pain work-up he had a Lexiscan and CT angiogram of the chest. The Lexiscan showed previous MI with hypokinesis. Echocardiogram showed EF of 25 to 30% and suspicion of possible secondary to alcohol but patient requires further evaluation. He was subsequently transferred here for left and right heart catheterization. He was also being treated for his pancreatitis with MRCP showing no acute findings. He has hypertriglyceridemia with triglyceride more than 1000. Patient reported significant alcohol abuse but no significant evidence of alcohol withdrawal. He is also a heavy smoker who was initiated nicotine patch prior to transfer here. At this point patient is to be treated for possible ischemic cardiomyopathy to be ruled out..  Assessment & Plan:   Principal Problem:   NSTEMI (non-ST elevated myocardial infarction) (HCC) Active Problems:   Systolic dysfunction   Alcoholic cardiomyopathy (HCC)   Tobacco abuse   Pancreatitis, acute   Hypertriglyceridemia  #1 new onset cardiomyopathy-echo outside hospital shows ejection fraction 25 to 30% with diffuse hypokinesis.  Patient has history of severe alcohol abuse. Patient had complaints of pleuritic chest pain  question perimyocarditis has been started on colchicine. Covid negative.   Cath 07/21/2019 nonobstructive but advanced disease with low filling pressures. Cardiac MRI - PENDING..  #2 pancreatitis secondary to hypertriglyceridemia/alcohol abuse. Still on clears advance diet lipase normal  #3 hypertriglyceridemia/hyperlipidemia triglycerides 4792.  Elevated LDL on statin and Vascepa  #4 elevated LFTs improving likely secondary to #2  #5 alcohol abuse CIWA protocol counseled for cessation  #6 tobacco abuse continue nicotine patch  Estimated body mass index is 21.61 kg/m as calculated from the following:   Height as of this encounter: 5\' 10"  (1.778 m).   Weight as of this encounter: 68.3 kg.  DVT prophylaxis: heparin Code Status: Full code Family Communication: None  disposition Plan: pending MRI report and cardiology clearance   Consultants:   Cardiology  Procedures: Cardiac cath 07/21/2019 Antimicrobials: None  Subjective: No complaints no nausea vomiting abdominal pain wants to eat real food  Objective: Vitals:   07/22/19 0457 07/22/19 0755 07/22/19 0944 07/22/19 1123  BP:  117/70 95/76 112/74  Pulse:  96 96 95  Resp:  16 14 18   Temp:  98.3 F (36.8 C) 97.8 F (36.6 C) 98.4 F (36.9 C)  TempSrc:  Oral Oral Oral  SpO2:  98% 100% 99%  Weight: 68.3 kg     Height:        Intake/Output Summary (Last 24 hours) at 07/22/2019 1313 Last data filed at 07/22/2019 1252 Gross per 24 hour  Intake 2545.06 ml  Output 1750 ml  Net 795.06 ml   Filed Weights   07/20/19 1901 07/21/19 0447 07/22/19 0457  Weight: 69.9 kg 69 kg 68.3 kg    Examination:  General exam: Appears calm and comfortable  Respiratory system: Clear to auscultation. Respiratory effort normal. Cardiovascular system: S1 & S2 heard,  RRR. No JVD, murmurs, rubs, gallops or clicks. No pedal edema. Gastrointestinal system: Abdomen is nondistended, soft and nontender. No organomegaly or masses felt.  Normal bowel sounds heard. Central nervous system: Alert and oriented. No focal neurological deficits. Extremities: Symmetric 5 x 5 power. Skin: No rashes, lesions or ulcers Psychiatry: Judgement and insight appear normal. Mood & affect appropriate.     Data Reviewed: I have personally reviewed following labs and imaging studies  CBC: Recent Labs  Lab 07/20/19 2208 07/21/19 0029 07/21/19 0944 07/22/19 0537  WBC 6.0 4.5  --  3.7*  NEUTROABS  --   --   --  2.4  HGB 12.2* 12.3* 12.6*  12.2* 11.4*  HCT 36.5* 35.9* 37.0*  36.0* 34.7*  MCV 99.5 100.3*  --  102.7*  PLT 126* 122*  --  161*   Basic Metabolic Panel: Recent Labs  Lab 07/20/19 2208 07/21/19 0542 07/21/19 0944 07/22/19 0537  NA 132*  --  134*  136 137  K 4.5  --  4.0  3.8 4.1  CL 99  --   --  103  CO2 27  --   --  26  GLUCOSE 107*  --   --  94  BUN <5*  --   --  6  CREATININE 0.70 0.83  --  0.87  CALCIUM 8.4*  --   --  8.5*  MG 1.8  --   --  1.9  PHOS 3.7  --   --   --    GFR: Estimated Creatinine Clearance: 111.2 mL/min (by C-G formula based on SCr of 0.87 mg/dL). Liver Function Tests: Recent Labs  Lab 07/20/19 2208 07/22/19 0537  AST 72* 50*  ALT 55* 42  ALKPHOS 150* 116  BILITOT 2.1* 1.3*  PROT 5.7* 5.4*  ALBUMIN 2.7* 2.6*   Recent Labs  Lab 07/22/19 0537  LIPASE 39   No results for input(s): AMMONIA in the last 168 hours. Coagulation Profile: No results for input(s): INR, PROTIME in the last 168 hours. Cardiac Enzymes: No results for input(s): CKTOTAL, CKMB, CKMBINDEX, TROPONINI in the last 168 hours. BNP (last 3 results) No results for input(s): PROBNP in the last 8760 hours. HbA1C: No results for input(s): HGBA1C in the last 72 hours. CBG: No results for input(s): GLUCAP in the last 168 hours. Lipid Profile: Recent Labs    07/20/19 2208  CHOL 484*  HDL 14*  LDLCALC UNABLE TO CALCULATE IF TRIGLYCERIDE OVER 400 mg/dL  TRIG 625*  CHOLHDL 34.6  LDLDIRECT 259.8*   Thyroid  Function Tests: No results for input(s): TSH, T4TOTAL, FREET4, T3FREE, THYROIDAB in the last 72 hours. Anemia Panel: No results for input(s): VITAMINB12, FOLATE, FERRITIN, TIBC, IRON, RETICCTPCT in the last 72 hours. Sepsis Labs: No results for input(s): PROCALCITON, LATICACIDVEN in the last 168 hours.  Recent Results (from the past 240 hour(s))  SARS CORONAVIRUS 2 (TAT 6-24 HRS) Nasopharyngeal Nasopharyngeal Swab     Status: None   Collection Time: 07/20/19  9:48 PM   Specimen: Nasopharyngeal Swab  Result Value Ref Range Status   SARS Coronavirus 2 NEGATIVE NEGATIVE Final    Comment: (NOTE) SARS-CoV-2 target nucleic acids are NOT DETECTED. The SARS-CoV-2 RNA is generally detectable in upper and lower respiratory specimens during the acute phase of infection. Negative results do not preclude SARS-CoV-2 infection, do not rule out co-infections with other pathogens, and should not be used as the sole basis for treatment or other patient management decisions. Negative results must be combined with  clinical observations, patient history, and epidemiological information. The expected result is Negative. Fact Sheet for Patients: HairSlick.no Fact Sheet for Healthcare Providers: quierodirigir.com This test is not yet approved or cleared by the [place] FDA and  has been authorized for detection and/or diagnosis of SARS-CoV-2 by FDA under an Emergency Use Authorization (EUA). This EUA will remain  in effect (meaning this test can be used) for the duration of the COVID-19 declaration under Section 56 4(b)(1) of the Act, 21 U.S.C. section 360bbb-3(b)(1), unless the authorization is terminated or revoked sooner. Performed at [facility], [address], [place], [place] [address]          Radiology Studies: CARDIAC CATHETERIZATION  Result Date: 07/21/2019 1. Low filling pressures. 2. Relatively preserved cardiac  output. 3. 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        Scheduled Meds: . aspirin EC  650 mg Oral Q8H  . colchicine  0.6 mg Oral Daily  . enoxaparin (LOVENOX) injection  40 mg Subcutaneous Q24H  . fenofibrate  160 mg Oral Daily  . folic acid  1 mg Oral Daily  . icosapent Ethyl  2 g Oral BID  . multivitamin with minerals  1 tablet Oral Daily  . nicotine  21 mg Transdermal Daily  . pantoprazole  40 mg Oral Daily  . pneumococcal 23 valent vaccine  0.5 mL Intramuscular Tomorrow-1000  . rosuvastatin  40 mg Oral q1800  . sodium chloride flush  3 mL Intravenous Q12H  . sodium chloride flush  3 mL Intravenous Q12H  . sodium chloride flush  3 mL Intravenous Q12H  . thiamine  100 mg Oral Daily   Or  . thiamine  100 mg Intravenous Daily   Continuous Infusions: . sodium chloride       LOS: 2 days     Alwyn Ren, MD [facility]  If 7PM-7AM, please contact night-coverage [email or web address] Password [email or web address] 07/22/2019, 1:13 PM

## 2019-07-22 NOTE — Plan of Care (Signed)

## 2019-07-23 DIAGNOSIS — I426 Alcoholic cardiomyopathy: Secondary | ICD-10-CM

## 2019-07-23 LAB — BASIC METABOLIC PANEL
Anion gap: 7 (ref 5–15)
BUN: 6 mg/dL (ref 6–20)
CO2: 26 mmol/L (ref 22–32)
Calcium: 8.7 mg/dL — ABNORMAL LOW (ref 8.9–10.3)
Chloride: 108 mmol/L (ref 98–111)
Creatinine, Ser: 0.9 mg/dL (ref 0.61–1.24)
GFR calc Af Amer: >60 mL/min (ref >60)
GFR calc non Af Amer: >60 mL/min (ref >60)
Glucose, Bld: 100 mg/dL — ABNORMAL HIGH (ref 70–99)
Potassium: 4.1 mmol/L (ref 3.5–5.1)
Sodium: 141 mmol/L (ref 135–145)

## 2019-07-23 LAB — CBC
HCT: 31.9 % — ABNORMAL LOW (ref 39.0–52.0)
Hemoglobin: 10.7 g/dL — ABNORMAL LOW (ref 13.0–17.0)
MCH: 34 pg (ref 26.0–34.0)
MCHC: 33.5 g/dL (ref 30.0–36.0)
MCV: 101.3 fL — ABNORMAL HIGH (ref 80.0–100.0)
Platelets: 125 10*3/uL — ABNORMAL LOW (ref 150–400)
RBC: 3.15 MIL/uL — ABNORMAL LOW (ref 4.22–5.81)
RDW: 14.5 % (ref 11.5–15.5)
WBC: 5.1 10*3/uL (ref 4.0–10.5)
nRBC: 0 % (ref 0.0–0.2)

## 2019-07-23 LAB — LIPASE, BLOOD: Lipase: 57 U/L — ABNORMAL HIGH (ref 11–51)

## 2019-07-23 MED ORDER — LOSARTAN POTASSIUM 25 MG PO TABS: 12.5000 mg | ORAL_TABLET | Freq: Every day | ORAL | 4 refills | Status: DC

## 2019-07-23 MED ORDER — FENOFIBRATE 160 MG PO TABS: 160.0000 mg | ORAL_TABLET | Freq: Every day | ORAL | 2 refills | Status: DC

## 2019-07-23 MED ORDER — LOSARTAN POTASSIUM 25 MG PO TABS: 12.5000 mg | ORAL_TABLET | Freq: Two times a day (BID) | ORAL | 2 refills | Status: DC

## 2019-07-23 MED ORDER — LOSARTAN POTASSIUM 25 MG PO TABS: 12.5000 mg | ORAL_TABLET | Freq: Two times a day (BID) | ORAL | Status: DC

## 2019-07-23 MED ORDER — CVS ASPIRIN ADULT LOW DOSE 81 MG PO CHEW: 81.0000 mg | CHEWABLE_TABLET | Freq: Every day | ORAL | 2 refills | Status: DC

## 2019-07-23 MED ORDER — SPIRONOLACTONE 25 MG PO TABS: 12.5000 mg | ORAL_TABLET | Freq: Every day | ORAL | 2 refills | Status: DC

## 2019-07-23 MED ORDER — COLCHICINE 0.6 MG PO TABS: 0.6000 mg | ORAL_TABLET | Freq: Every day | ORAL | 0 refills | Status: DC

## 2019-07-23 MED ORDER — DIGOXIN 125 MCG PO TABS: 0.1250 mg | ORAL_TABLET | Freq: Every day | ORAL | 2 refills | Status: DC

## 2019-07-23 MED ORDER — PANTOPRAZOLE SODIUM 40 MG PO TBEC: 40.0000 mg | DELAYED_RELEASE_TABLET | Freq: Every day | ORAL | 2 refills | Status: DC

## 2019-07-23 MED ORDER — ROSUVASTATIN CALCIUM 40 MG PO TABS: 40.0000 mg | ORAL_TABLET | Freq: Every day | ORAL | 2 refills | Status: DC

## 2019-07-23 MED ORDER — ICOSAPENT ETHYL 1 G PO CAPS: 2.0000 g | ORAL_CAPSULE | Freq: Two times a day (BID) | ORAL | 2 refills | Status: DC

## 2019-07-23 NOTE — Progress Notes (Addendum)
Advanced Heart Failure Rounding Note  PCP-Cardiologist: No primary care provider on file.   Subjective:    New CM. EF 25-30%. Nonischemic   RHC/LHC 07/21/19   Coronary Findings  Diagnostic Dominance: Right Left Main  20% distal tapering.  Left Anterior Descending  Distal vessel narrowing without discrete stenosis. 40% ostial D1.  Ramus Intermedius  Moderate vessel with distal vessel tapering.  Left Circumflex  Large OM1 with luminal irregularities. Small AV LCx beyond OM1 with diffuse luminal irregularities.  Right Coronary Artery  Luminal irregularities.  Intervention  No interventions have been documented. Right Heart  Right Heart Pressures RHC Procedural Findings: Hemodynamics (mmHg) RA mean 1 RV 14/2 PA 14/2 PCWP mean 2 LV 76/0 AO 83/61  Oxygen saturations: PA 63% AO 99%  Cardiac Output (Fick) 4.13  Cardiac Index (Fick) 2.21    cMRI 12/30 EF 10% w/ diffuse hypokinesis. No myocardial LGE, so no definitive evidence for prior MI, myocarditis, or infiltrative disease.  Feels well today. No further chest or epigastric pain. No dyspnea. Tolerating new HF medications ok. BP stable. No SEs.    Objective:   Weight Range: 69.2 kg Body mass index is 21.88 kg/m.   Vital Signs:   Temp:  [97.6 F (36.4 C)-98.4 F (36.9 C)] 97.9 F (36.6 C) (12/31 0901) Pulse Rate:  [73-101] 84 (12/31 0901) Resp:  [14-18] 14 (12/31 0901) BP: (104-120)/(71-89) 110/71 (12/31 0901) SpO2:  [98 %-100 %] 99 % (12/31 0901) Weight:  [69.2 kg] 69.2 kg (12/31 0411) Last BM Date: 07/20/19  Weight change: Filed Weights   07/21/19 0447 07/22/19 0457 07/23/19 0411  Weight: 69 kg 68.3 kg 69.2 kg    Intake/Output:   Intake/Output Summary (Last 24 hours) at 07/23/2019 0952 Last data filed at 07/23/2019 0845 Gross per 24 hour  Intake 1977 ml  Output 930 ml  Net 1047 ml      Physical Exam    General:  Well appearing young WM. No resp difficulty HEENT: Normal Neck:  Supple. No JVP . Carotids 2+ bilat; no bruits. No lymphadenopathy or thyromegaly appreciated. Cor: PMI nondisplaced. Regular rate & rhythm. No rubs, gallops or murmurs. Lungs: Clear Abdomen: Soft, nontender, nondistended. No hepatosplenomegaly. No bruits or masses. Good bowel sounds. Extremities: No cyanosis, clubbing, rash, edema Neuro: Alert & orientedx3, cranial nerves grossly intact. moves all 4 extremities w/o difficulty. Affect pleasant   Telemetry   NSR 90s.   EKG    N/A  Labs    CBC Recent Labs    07/22/19 0537 07/23/19 0445  WBC 3.7* 5.1  NEUTROABS 2.4  --   HGB 11.4* 10.7*  HCT 34.7* 31.9*  MCV 102.7* 101.3*  PLT 132* 500*   Basic Metabolic Panel Recent Labs    07/20/19 2208 07/21/19 0542 07/22/19 0537 07/23/19 0445  NA 132*   < > 137 141  K 4.5   < > 4.1 4.1  CL 99  --  103 108  CO2 27  --  26 26  GLUCOSE 107*  --  94 100*  BUN <5*  --  6 6  CREATININE 0.70  --  0.87 0.90  CALCIUM 8.4*  --  8.5* 8.7*  MG 1.8  --  1.9  --   PHOS 3.7  --   --   --    < > = values in this interval not displayed.   Liver Function Tests Recent Labs    07/20/19 2208 07/22/19 0537  AST 72* 50*  ALT 55*  42  ALKPHOS 150* 116  BILITOT 2.1* 1.3*  PROT 5.7* 5.4*  ALBUMIN 2.7* 2.6*   Recent Labs    07/22/19 0537 07/23/19 0445  LIPASE 39 57*   Cardiac Enzymes No results for input(s): CKTOTAL, CKMB, CKMBINDEX, TROPONINI in the last 72 hours.  BNP: BNP (last 3 results) No results for input(s): BNP in the last 8760 hours.  ProBNP (last 3 results) No results for input(s): PROBNP in the last 8760 hours.   D-Dimer No results for input(s): DDIMER in the last 72 hours. Hemoglobin A1C No results for input(s): HGBA1C in the last 72 hours. Fasting Lipid Panel Recent Labs    07/20/19 2208  CHOL 484*  HDL 14*  LDLCALC UNABLE TO CALCULATE IF TRIGLYCERIDE OVER 400 mg/dL  TRIG 536*  CHOLHDL 46.8  LDLDIRECT 259.8*   Thyroid Function Tests No results for  input(s): TSH, T4TOTAL, T3FREE, THYROIDAB in the last 72 hours.  Invalid input(s): FREET3  Other results:   Imaging    No results found.   Medications:     Scheduled Medications: . colchicine  0.6 mg Oral Daily  . digoxin  0.125 mg Oral Daily  . enoxaparin (LOVENOX) injection  40 mg Subcutaneous Q24H  . fenofibrate  160 mg Oral Daily  . folic acid  1 mg Oral Daily  . icosapent Ethyl  2 g Oral BID  . losartan  12.5 mg Oral QHS  . multivitamin with minerals  1 tablet Oral Daily  . nicotine  21 mg Transdermal Daily  . pantoprazole  40 mg Oral Daily  . pneumococcal 23 valent vaccine  0.5 mL Intramuscular Tomorrow-1000  . rosuvastatin  40 mg Oral q1800  . sodium chloride flush  3 mL Intravenous Q12H  . spironolactone  12.5 mg Oral Daily  . thiamine  100 mg Oral Daily   Or  . thiamine  100 mg Intravenous Daily    Infusions: . sodium chloride      PRN Medications: sodium chloride, acetaminophen, LORazepam **OR** LORazepam, ondansetron (ZOFRAN) IV, sodium chloride flush    Patient Profile   Richard Valenzuela is a 38 year old male with personal history of heavy alcohol and tobacco use, along with family history of premature coronary artery disease in first-degree relatives, seen today for evaluation of new systolic HF/ cardiomyopathy, EF 25-20%. NICM. 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   Assessment/Plan    1. Acute systolic CHF: Echo at [facility] with EF 25-30%, normal RV.  Cardiolite showed EF 15% with fixed defect.  LHC/RHC 12/29 showed nonobstructive coronary disease and low filling pressures, CI 2.2.  Has had pleuritic chest pain possibly consistent with perimyocarditis however hs troponin not elevated and cRMi w/o evidence of myocarditis. Cardiomyopathy could be related to heavy ETOH use.  - Diuretics initially held. Was given IVFs (NS 125 cc/hr x 10 hours post-cath) for very low filling pressures and soft BP, in setting of pancreatitis.  - tolerating low dose losartan 12.5  qhs + spiro 12.5 daily. SCr and K stable. BP too soft for Entresto addition currently.  - continue digoxin 0.125 mg daily  - Needs to stop ETOH.  - Cardiac MRI w/o myocardial LGE, so no definitive evidence for prior MI, myocarditis, or infiltrative disease. 2. 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  FH of CAD, markedly high lipids, smoker.   - Will need ASA 81 long-term.  - Started on Crestor 40 mg daily.  3. Hyperlipidemia: LDL 260, HDL 14, TGs 625 (but 4792 when checked at [facility]).  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  - Started on Crestor 40 mg daily, may require PCSK9 inhibitor.  - Started Vascepa and fenofibrate. Given 30 day card for Vascepa. Copay $3 but requires PA. This has been submitted by pharmD.  - Needs repeat FLP and HFTs in 6-8 wks 4. Acute pancreatitis: Amylase and lipase elevated. ?Due to ETOH vs elevated triglycerides vs both.  Very dry on RHC. - received IVFs.  - Pain resolved but lipase trending back up 39>>57 - further management Per primary service.  5 Smoking: Needs to quit.  - continue nicotine patch 6. ETOH abuse: Needs to quit.  5. Anemia: steady decline in hgb this admit, 12.2>>11.4>>10.7. H/o heavy ETOH use. MCV 102 c/w macrocytic anemia,  likely 2/2 folate or vit B 12 deficiency - management per IM  - with heavy ETOH use and daily ASA for CAD, may also benefit from continuation of PPI at discharge for GI protection   Nearing d/c from HF standpoint. Will arrange post hospital f/u in [facility] and will place appt in AVS.   Length of Stay: 3  Brittainy Delmer Islam  07/23/2019, 9:52 AM  Advanced Heart Failure Team Pager [phone] (M-F; 7a - 4p)  Please contact [facility] for night-coverage after hours (4p -7a ) and weekends on [email or web address]  Robbie Lis PA-C 07/23/2019 9:52 AM   Patient seen with PA, agree with the above note.   No complaints today.  No abdominal or chest pain, no dyspnea.  Wants to go home.   General: NAD Neck: No JVD, no  thyromegaly or thyroid nodule.  Lungs: Clear to auscultation bilaterally with normal respiratory effort. CV: Nondisplaced PMI.  Heart regular S1/S2, no S3/S4, no murmur.  No peripheral edema.   Abdomen: Soft, nontender, no hepatosplenomegaly, no distention.  Skin: Intact without lesions or rashes.  Neurologic: Alert and oriented x 3.  Psych: Normal affect. Extremities: No clubbing or cyanosis.  HEENT: Normal.   Stable today.  BP still soft.  I will increase losartan to 12.5 mg bid and will plan to transition to Entresto at followup.    Imperative to stop smoking and drinking completely.    He will need close followup in 10 days or so in CHF clinic.  Cardiac meds for discharge: digoxin 0.125 daily, losartan 12.5 mg bid, spironolactone 12.5 daily, ASA 81 daily, Crestor 40 daily, Vascepa 2 g bid, fenofibrate 160 daily, colchicine 0.6 mg daily x 1 month (not convinced he had pericarditis also but will keep this going for a month longer).   Marca Ancona 07/23/2019 1:24 PM

## 2019-07-23 NOTE — Care Management (Signed)
Benefit check sent for colchicine 

## 2019-07-23 NOTE — Care Management (Signed)
Per Tamika D. W/[employer] co-pay amount for Colchicine Tablet 0.6 mg for 30 day supply $10.00  for retail pharmacy ,Mail order for 90 day supply $25.00.  PA not required Tier 1 medication [facility]: [facility].

## 2019-07-23 NOTE — Care Management (Addendum)
Sent the following information to Dr Rodena Piety and Geannie Risen PA via secure chat.The heart failure clinic pharmacist has completed the PA. I confirmed w patient that he received the coupon card yesterday.Follows up have been arranged.     Holli Humbles S  Care Management Assistant  [facility] CM/SW     Per Craig Guess w/[employer] rx. Latanya Maudlin Ethyl (VASCEPA) 1G capsule Dose 2G twice a day not covered.  PA required @ [phone].

## 2019-07-23 NOTE — Care Management (Signed)
Per Craig Guess w/[employer] rx. icospent Ethyl (VASCEPA) 1G capsule Dose 2G twice a day not covered.  PA required @ [phone].

## 2019-07-27 ENCOUNTER — Ambulatory Visit (HOSPITAL_COMMUNITY)
Admit: 2019-07-27 | Discharge: 2019-07-27 | Disposition: A | Discharge status: Home / Self Care | Payer: Commercial Managed Care - PPO | Attending: Cardiology | Admitting: Cardiology

## 2019-07-27 ENCOUNTER — Other Ambulatory Visit: Payer: Self-pay

## 2019-07-27 ENCOUNTER — Encounter (HOSPITAL_COMMUNITY): Payer: Self-pay | Admitting: Cardiology

## 2019-07-27 VITALS — BP 107/69 | HR 85 | Wt 157.0 lb

## 2019-07-27 DIAGNOSIS — Z8249 Family history of ischemic heart disease and other diseases of the circulatory system: Secondary | ICD-10-CM | POA: Diagnosis not present

## 2019-07-27 DIAGNOSIS — Z87891 Personal history of nicotine dependence: Secondary | ICD-10-CM | POA: Diagnosis not present

## 2019-07-27 DIAGNOSIS — K859 Acute pancreatitis without necrosis or infection, unspecified: Secondary | ICD-10-CM | POA: Insufficient documentation

## 2019-07-27 DIAGNOSIS — E785 Hyperlipidemia, unspecified: Secondary | ICD-10-CM | POA: Diagnosis not present

## 2019-07-27 DIAGNOSIS — Z7982 Long term (current) use of aspirin: Secondary | ICD-10-CM | POA: Diagnosis not present

## 2019-07-27 DIAGNOSIS — F1010 Alcohol abuse, uncomplicated: Secondary | ICD-10-CM | POA: Diagnosis not present

## 2019-07-27 DIAGNOSIS — I2510 Atherosclerotic heart disease of native coronary artery without angina pectoris: Secondary | ICD-10-CM | POA: Diagnosis not present

## 2019-07-27 DIAGNOSIS — I519 Heart disease, unspecified: Secondary | ICD-10-CM | POA: Diagnosis not present

## 2019-07-27 DIAGNOSIS — E781 Pure hyperglyceridemia: Secondary | ICD-10-CM | POA: Diagnosis not present

## 2019-07-27 DIAGNOSIS — I5022 Chronic systolic (congestive) heart failure: Secondary | ICD-10-CM | POA: Insufficient documentation

## 2019-07-27 DIAGNOSIS — Z79899 Other long term (current) drug therapy: Secondary | ICD-10-CM | POA: Insufficient documentation

## 2019-07-27 LAB — COMPREHENSIVE METABOLIC PANEL
ALT: 37 U/L (ref 0–44)
AST: 45 U/L — ABNORMAL HIGH (ref 15–41)
Albumin: 3.7 g/dL (ref 3.5–5.0)
Alkaline Phosphatase: 90 U/L (ref 38–126)
Anion gap: 8 (ref 5–15)
BUN: 16 mg/dL (ref 6–20)
CO2: 24 mmol/L (ref 22–32)
Calcium: 9.3 mg/dL (ref 8.9–10.3)
Chloride: 103 mmol/L (ref 98–111)
Creatinine, Ser: 0.86 mg/dL (ref 0.61–1.24)
GFR calc Af Amer: >60 mL/min (ref >60)
GFR calc non Af Amer: >60 mL/min (ref >60)
Glucose, Bld: 102 mg/dL — ABNORMAL HIGH (ref 70–99)
Potassium: 4.1 mmol/L (ref 3.5–5.1)
Sodium: 135 mmol/L (ref 135–145)
Total Bilirubin: 1 mg/dL (ref 0.3–1.2)
Total Protein: 7.4 g/dL (ref 6.5–8.1)

## 2019-07-27 LAB — DIGOXIN LEVEL: Digoxin Level: 0.2 ng/mL — ABNORMAL LOW (ref 0.8–2.0)

## 2019-07-27 MED ORDER — SPIRONOLACTONE 25 MG PO TABS: 25.0000 mg | ORAL_TABLET | Freq: Every day | ORAL | 2 refills | Status: DC

## 2019-07-27 MED ORDER — FENOFIBRATE 48 MG PO TABS: 48.0000 mg | ORAL_TABLET | Freq: Every day | ORAL | 6 refills | Status: DC

## 2019-07-27 MED ORDER — SPIRONOLACTONE 25 MG PO TABS: 25.0000 mg | ORAL_TABLET | Freq: Every day | ORAL | 6 refills | Status: DC

## 2019-07-27 MED ORDER — CARVEDILOL 3.125 MG PO TABS: 3.1250 mg | ORAL_TABLET | Freq: Two times a day (BID) | ORAL | 3 refills | Status: DC

## 2019-07-27 NOTE — Progress Notes (Signed)
Called in Aldactone 12.5 mg qd and fenofibrate 48 mg to [facility] [phone].

## 2019-07-27 NOTE — Discharge Summary (Signed)
Physician Discharge Summary  Richard Valenzuela ZOX:[id number] DOB: Aug 15, 1980 DOA: 07/20/2019  PCP: Patient, No Pcp Per  Admit date: 07/20/2019 Discharge date: 07/27/2019  Admitted From: Home Disposition: Home  recommendations for Outpatient Follow-up:  1. Follow up with PCP in 1-2 weeks 2. Please obtain BMP/CBC in one week 3. 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 Patient was admitted on Christmas Day with chest pain. Pain was intermittent. No significant shortness of breath. Patient was evaluated for possible PE. COVID-19 was negative. LFTs showed change including total bilirubin of 3.3 at the time. Lactic acid was noted to be mildly elevated. Procalcitonin also elevated. As part of patient's work-up including chest pain work-up he had a Lexiscan and CT angiogram of the chest. The Lexiscan showed previous MI with hypokinesis. Echocardiogram showed EF of 25 to 30% and suspicion of possible secondary to alcohol but patient requires further evaluation. He was subsequently transferred here for left and right heart catheterization. He was also being treated for his pancreatitis with MRCP showing no acute findings. He has hypertriglyceridemia with triglyceride more than 1000. Patient reported significant alcohol abuse but no significant evidence of alcohol withdrawal. He is also a heavy smoker who was initiated nicotine patch prior to transfer here. At this point patient is to be treated for possible ischemic cardiomyopathy to be ruled out.Marland Kitchen Discharge Diagnoses:  Principal Problem:    NSTEMI (non-ST elevated myocardial infarction) (HCC) Active Problems:   Systolic dysfunction   Alcoholic cardiomyopathy (HCC)   Tobacco abuse   Pancreatitis, acute   Hypertriglyceridemia  #1 new onset cardiomyopathy-echo outside hospital shows ejection fraction 25 to 30% with diffuse hypokinesis. Patient has history of severe alcohol abuse. Patient had complaints of pleuritic chest pain question perimyocarditis has been started on colchicine. Covid negative.  Cath 07/21/2019 nonobstructive but advanced disease with low filling pressures. Cardiac MRI -no evidence of infiltrative disorders.  Ejection fraction 10% with diffuse hypokinesis.  No evidence of infiltrative disease myocarditis or MI. Discharge meds include digoxin, losartan, spironolactone, aspirin, Crestor, Vascepa, fenofibrate, colchicine.  #2 pancreatitis secondary to hypertriglyceridemia/alcohol abuse.   Patient tolerated diet prior to discharge.  #3 hypertriglyceridemia/hyperlipidemia triglycerides 4792. Elevated LDL on statin and Vascepa  #4 elevated LFTs improving likely secondary to #2  #5 alcohol abuse CIWA protocol counseled for cessation  #6 tobacco abuse continue nicotine patch   Estimated body mass index is 21.88 kg/m as calculated from the following:   Height as of this encounter: 5\' 10"  (1.778 m).   Weight as of this encounter: 69.2 kg.  Discharge Instructions   Allergies as of 07/23/2019   No Known Allergies     Medication List    STOP taking these medications   Entresto 24-26 MG Generic drug: sacubitril-valsartan   gemfibrozil 600 MG tablet Commonly known as: LOPID   metoprolol tartrate 25 MG tablet Commonly known as: LOPRESSOR     TAKE these medications   acetaminophen 650 MG CR tablet Commonly known as: TYLENOL Take 650 mg by mouth every 8 (eight) hours as needed for pain.   colchicine 0.6 MG tablet Take 1 tablet (0.6 mg total) by mouth daily.   [facility] Aspirin Adult Low Dose 81  MG chewable tablet  Generic drug: aspirin Chew 1 tablet (81 mg total) by mouth daily.   digoxin 0.125 MG tablet Commonly known as: LANOXIN Take 1 tablet (0.125 mg total) by mouth daily.   folic acid 1 MG tablet Commonly known as: FOLVITE Take 1 tablet (1 mg total) by mouth daily.   icosapent Ethyl 1 g capsule Commonly known as: VASCEPA Take 2 capsules (2 g total) by mouth 2 (two) times daily.   losartan 25 MG tablet Commonly known as: COZAAR Take 0.5 tablets (12.5 mg total) by mouth 2 (two) times daily.   multivitamin with minerals Tabs tablet Take 1 tablet by mouth daily.   nicotine 21 mg/24hr patch Commonly known as: NICODERM CQ - dosed in mg/24 hours Place 1 patch (21 mg total) onto the skin daily.   pantoprazole 40 MG tablet Commonly known as: PROTONIX Take 1 tablet (40 mg total) by mouth daily. What changed:   medication strength  how much to take   rosuvastatin 40 MG tablet Commonly known as: CRESTOR Take 1 tablet (40 mg total) by mouth daily at 6 PM.   thiamine 100 MG tablet Take 1 tablet (100 mg total) by mouth daily.      Follow-up Information    [facility] Follow up on 07/27/2019.   Specialty: Cardiology Why: 2:00 PM Heart Failure Clinic Parking Garage Code [id number]  Contact information: [address] [id number] Wilhe[place] [address] [phone]         No Known Allergies  Consultations:  Heart failure clinic   Procedures/Studies: CARDIAC CATHETERIZATION  Result Date: 07/21/2019 1. Low filling pressures. 2. Relatively preserved cardiac output. 3. 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   MR CARDIAC MORPHOLOGY W WO CONTRAST  Result Date: 07/22/2019 CLINICAL DATA:  Nonischemic cardiomyopathy EXAM: CARDIAC MRI TECHNIQUE: The patient was scanned on a 1.5 Tesla GE magnet. A dedicated cardiac coil was used. Functional imaging was done using Fiesta  sequences. 2,3, and 4 chamber views were done to assess for RWMA's. Modified Simpson's rule using a short axis stack was used to calculate an ejection fraction on a dedicated work [employer]. The patient received 7 cc of Gadavist. After 10 minutes inversion recovery sequences were used to assess for infiltration and scar tissue. CONTRAST:  Gadavist 7 cc FINDINGS: Limited images of the lung fields showed no gross abnormalities. Moderately dilated left ventricle with normal wall thickness. Diffuse hypokinesis with EF 10%. No LV thrombus noted. Normal right ventricular size with moderate-severe systolic dysfunction. Trileaflet aortic valve with no stenosis or regurgitation. No significant mitral regurgitation noted though flow sequences to quantify were not done. On delayed enhancement imaging, there was no myocardial late gadolinium enhancement (LGE). Images not adequate for calculation of EF. IMPRESSION: 1.  Moderate LV dilation with EF 10%.  Diffuse hypokinesis. 2. Normal right ventricular size with mild to moderately decreased systolic function. 3. No myocardial LGE, so no definitive evidence for prior MI, myocarditis, or infiltrative disease. Dalton Mclean Electronically Signed   By: Marca Ancona M.D.   On: 07/22/2019 21:11    (Echo, Carotid, EGD, Colonoscopy, ERCP)    Subjective: Resting in bed in no acute distress denies shortness of breath laying flat in bed  Discharge Exam: Vitals:   07/23/19 0901 07/23/19 1325  BP: 110/71 (!) 111/92  Pulse: 84 91  Resp: 14 16  Temp: 97.9 F (36.6 C) 98.2 F (36.8 C)  SpO2: 99% 100%   Vitals:   07/23/19  0411 07/23/19 0827 07/23/19 0901 07/23/19 1325  BP: 118/89 120/79 110/71 (!) 111/92  Pulse: 79 73 84 91  Resp: 17 16 14 16   Temp: 98 F (36.7 C) 98 F (36.7 C) 97.9 F (36.6 C) 98.2 F (36.8 C)  TempSrc: Oral Oral Oral Oral  SpO2: 99% 100% 99% 100%  Weight: 69.2 kg     Height:        General: Pt is alert, awake, not in  acute distress Cardiovascular: RRR, S1/S2 +, no rubs, no gallops Respiratory: CTA bilaterally, no wheezing, no rhonchi Abdominal: Soft, NT, ND, bowel sounds + Extremities: no edema, no cyanosis    The results of significant diagnostics from this hospitalization (including imaging, microbiology, ancillary and laboratory) are listed below for reference.     Microbiology: Recent Results (from the past 240 hour(s))  SARS CORONAVIRUS 2 (TAT 6-24 HRS) Nasopharyngeal Nasopharyngeal Swab     Status: None   Collection Time: 07/20/19  9:48 PM   Specimen: Nasopharyngeal Swab  Result Value Ref Range Status   SARS Coronavirus 2 NEGATIVE NEGATIVE Final    Comment: (NOTE) SARS-CoV-2 target nucleic acids are NOT DETECTED. The SARS-CoV-2 RNA is generally detectable in upper and lower respiratory specimens during the acute phase of infection. Negative results do not preclude SARS-CoV-2 infection, do not rule out co-infections with other pathogens, and should not be used as the sole basis for treatment or other patient management decisions. Negative results must be combined with clinical observations, patient history, and epidemiological information. The expected result is Negative. Fact Sheet for Patients: 07/22/19 Fact Sheet for Healthcare Providers: HairSlick.no This test is not yet approved or cleared by the [place]irigir.com FDA and  has been authorized for detection and/or diagnosis of SARS-CoV-2 by FDA under an Emergency Use Authorization (EUA). This EUA will remain  in effect (meaning this test can be used) for the duration of the COVID-19 declaration under Section 56 4(b)(1) of the Act, 21 U.S.C. section 360bbb-3(b)(1), unless the authorization is terminated or revoked sooner. Performed at [facility], [address] [place], [place]      Labs: BNP (last 3 results) No results for input(s): BNP in the last  8760 hours. Basic Metabolic Panel: Recent Labs  Lab 07/20/19 2208 07/21/19 0542 07/21/19 0944 07/22/19 0537 07/23/19 0445  NA 132*  --  134*  136 137 141  K 4.5  --  4.0  3.8 4.1 4.1  CL 99  --   --  103 108  CO2 27  --   --  26 26  GLUCOSE 107*  --   --  94 100*  BUN <5*  --   --  6 6  CREATININE 0.70 0.83  --  0.87 0.90  CALCIUM 8.4*  --   --  8.5* 8.7*  MG 1.8  --   --  1.9  --   PHOS 3.7  --   --   --   --    Liver Function Tests: Recent Labs  Lab 07/20/19 2208 07/22/19 0537  AST 72* 50*  ALT 55* 42  ALKPHOS 150* 116  BILITOT 2.1* 1.3*  PROT 5.7* 5.4*  ALBUMIN 2.7* 2.6*   Recent Labs  Lab 07/22/19 0537 07/23/19 0445  LIPASE 39 57*   No results for input(s): AMMONIA in the last 168 hours. CBC: Recent Labs  Lab 07/20/19 2208 07/21/19 0029 07/21/19 0944 07/22/19 0537 07/23/19 0445  WBC 6.0 4.5  --  3.7* 5.1  NEUTROABS  --   --   --  2.4  --   HGB 12.2* 12.3* 12.6*  12.2* 11.4* 10.7*  HCT 36.5* 35.9* 37.0*  36.0* 34.7* 31.9*  MCV 99.5 100.3*  --  102.7* 101.3*  PLT 126* 122*  --  132* 125*   Cardiac Enzymes: No results for input(s): CKTOTAL, CKMB, CKMBINDEX, TROPONINI in the last 168 hours. BNP: Invalid input(s): POCBNP CBG: No results for input(s): GLUCAP in the last 168 hours. D-Dimer No results for input(s): DDIMER in the last 72 hours. Hgb A1c No results for input(s): HGBA1C in the last 72 hours. Lipid Profile No results for input(s): CHOL, HDL, LDLCALC, TRIG, CHOLHDL, LDLDIRECT in the last 72 hours. Thyroid function studies No results for input(s): TSH, T4TOTAL, T3FREE, THYROIDAB in the last 72 hours.  Invalid input(s): FREET3 Anemia work up No results for input(s): VITAMINB12, FOLATE, FERRITIN, TIBC, IRON, RETICCTPCT in the last 72 hours. Urinalysis No results found for: COLORURINE, APPEARANCEUR, [place], [place], GLUCOSEU, [place], [place], [place], PROTEINUR, UROBILINOGEN, NITRITE, LEUKOCYTESUR Sepsis Labs Invalid input(s):  PROCALCITONIN,  WBC,  LACTICIDVEN Microbiology Recent Results (from the past 240 hour(s))  SARS CORONAVIRUS 2 (TAT 6-24 HRS) Nasopharyngeal Nasopharyngeal Swab     Status: None   Collection Time: 07/20/19  9:48 PM   Specimen: Nasopharyngeal Swab  Result Value Ref Range Status   SARS Coronavirus 2 NEGATIVE NEGATIVE Final    Comment: (NOTE) SARS-CoV-2 target nucleic acids are NOT DETECTED. The SARS-CoV-2 RNA is generally detectable in upper and lower respiratory specimens during the acute phase of infection. Negative results do not preclude SARS-CoV-2 infection, do not rule out co-infections with other pathogens, and should not be used as the sole basis for treatment or other patient management decisions. Negative results must be combined with clinical observations, patient history, and epidemiological information. The expected result is Negative. Fact Sheet for Patients: SugarRoll.be Fact Sheet for Healthcare Providers: https://www.woods-Damyah Gugel.com/ This test is not yet approved or cleared by the [place] FDA and  has been authorized for detection and/or diagnosis of SARS-CoV-2 by FDA under an Emergency Use Authorization (EUA). This EUA will remain  in effect (meaning this test can be used) for the duration of the COVID-19 declaration under Section 56 4(b)(1) of the Act, 21 U.S.C. section 360bbb-3(b)(1), unless the authorization is terminated or revoked sooner. Performed at [facility], [place] [address], [place], [place] [address]      Time coordinating discharge: 39 minutes  SIGNED:   Georgette Shell, MD  [facility] 07/27/2019, 3:40 PM Pager   If 7PM-7AM, please contact night-coverage [email or web address] Password [email or web address]

## 2019-07-27 NOTE — Progress Notes (Signed)
Cardiology: Dr. Aundra Dubin  39 y.o. with history of ETOH abuse and smoking, nonobstructive CAD, and primarily nonischemic cardiomyopathy presents for followup of CHF.  Patient has a strong family history of CAD, both his mother and father had CAD diagnosed in their 63s.  He moved to [place] about a year ago from [place] for his job.  Initially, he was here without his family and started drinking quite heavily. He also has smoked chronically.   He was admitted in 12/20 with pleuritic chest pain and abdominal pain.  Amylase and lipase were high suggesting acute pancreatitis, due to ETOH but also with possible contribution from very high triglycerides (4000 range initially).  Echo was done, showing EF 25%. Patient was initially admitted to [facility], but at this point was transferred to [facility].  CMRI showed EF 10% with no LGE.  Cath showed low filling pressures and age-advanced CAD but no tight blockages. No arrhythmias were noted while in the hospital.  He was started slowly on cardiac meds (due to soft BP) and discharged home.   He has been stable at home.  No further chest or abdominal pain.  No significant exertional dyspnea.  No palpitations.  No orthopnea/PND.  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  He has not been taking colchicine. He is not drinking or smoking.   ECG (personally reviewed, 12/20): NSR, normal  Labs (12/20): K 4.1, creatinine 0.9, LDL 260, TGs 625, HDL 14  PMH: 1. ETOH abuse 2. Smoking 3. H/o ETOH pancreatitis in 12/20. 4. 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  5. Chronic systolic CHF: Primarily nonischemic cardiomyopathy.  Possibly related to ETOH abuse.  - Echo (12/20): Severely dilated LV with EF 25-30%.  - Cardiac MRI (12/20): EF 10%, moderate LV dilation, mild-moderately decreased RV systolic function, no late gadolinium enhancement.  - RHC/LHC (12/20): mean RA 1, PA 14/2, mean PCWP 2, CI 2.2.  Distal LAD narrows without severe discrete stenosis, 40% ostial D1, diffuse  luminal irregularities.  6. CAD: Coronary angiography in 12/20 showed that distal LAD narrows without severe discrete stenosis, 40% ostial D1, diffuse luminal irregularities.   SH: Married, works for a [employer], originally from [place], history of ETOH abuse and smoking.   FH: Father with CAD at age 90, mother with stents in her 17s.   ROS: All systems reviewed and negative except as per HPI.   Current Outpatient Medications  Medication Sig Dispense Refill  . acetaminophen (TYLENOL) 650 MG CR tablet Take 650 mg by mouth every 8 (eight) hours as needed for pain.    Marland Kitchen colchicine 0.6 MG tablet Take 1 tablet (0.6 mg total) by mouth daily. 30 tablet 0  . [facility] ASPIRIN ADULT LOW DOSE 81 MG chewable tablet Chew 1 tablet (81 mg total) by mouth daily. 30 tablet 2  . digoxin (LANOXIN) 0.125 MG tablet Take 1 tablet (0.125 mg total) by mouth daily. 30 tablet 2  . fenofibrate (TRICOR) 48 MG tablet Take 1 tablet (48 mg total) by mouth daily. 30 tablet 6  . folic acid (FOLVITE) 1 MG tablet Take 1 tablet (1 mg total) by mouth daily.    Marland Kitchen icosapent Ethyl (VASCEPA) 1 g capsule Take 2 capsules (2 g total) by mouth 2 (two) times daily. 120 capsule 2  . losartan (COZAAR) 25 MG tablet Take 0.5 tablets (12.5 mg total) by mouth 2 (two) times daily. 30 tablet 2  . Multiple Vitamin (MULTIVITAMIN WITH MINERALS) TABS tablet Take 1 tablet by mouth daily.    Marland Kitchen  nicotine (NICODERM CQ - DOSED IN MG/24 HOURS) 21 mg/24hr patch Place 1 patch (21 mg total) onto the skin daily. 28 patch 0  . pantoprazole (PROTONIX) 40 MG tablet Take 1 tablet (40 mg total) by mouth daily. 30 tablet 2  . rosuvastatin (CRESTOR) 40 MG tablet Take 1 tablet (40 mg total) by mouth daily at 6 PM. 30 tablet 2  . spironolactone (ALDACTONE) 25 MG tablet Take 1 tablet (25 mg total) by mouth at bedtime. 30 tablet 6  . thiamine 100 MG tablet Take 1 tablet (100 mg total) by mouth daily.    . carvedilol (COREG) 3.125 MG tablet Take 1 tablet (3.125 mg total) by  mouth 2 (two) times daily. 180 tablet 3   No current facility-administered medications for this encounter.   BP 107/69   Pulse 85   Wt 71.2 kg (157 lb)   SpO2 100%   BMI 22.53 kg/m  General: NAD Neck: No JVD, no thyromegaly or thyroid nodule.  Lungs: Clear to auscultation bilaterally with normal respiratory effort. CV: Nondisplaced PMI.  Heart regular S1/S2, no S3/S4, no murmur.  No peripheral edema.  No carotid bruit.  Normal pedal pulses.  Abdomen: Soft, nontender, no hepatosplenomegaly, no distention.  Skin: Intact without lesions or rashes.  Neurologic: Alert and oriented x 3.  Psych: Normal affect. Extremities: No clubbing or cyanosis.  HEENT: Normal.   Assessment/Plan: 1. Chronic systolic CHF: Echo at [facility] in 12/20 with EF 25-30%, normal RV. Cardiolite showed EF 15% with fixed defect. LHC/RHC 12/20 showed nonobstructive coronary disease and low filling pressures, CI 2.2. Has had pleuritic chest pain possibly consistent with perimyocarditis however hs troponin not elevated and cRMI in 12/20 w/o evidence of myocarditis or infiltrative disease. Cardiomyopathy may be related to heavy ETOH use.  NYHA class II symptoms now.  Not volume overloaded on exam.  - Continue losartan 12.5 mg bid.   - He does not need a diuretic.  - Continue digoxin 0.125 daily, check level today.  - Increase spironolactone to 25 mg daily and check BMET today and in 10 days.  - Add Coreg 3.125 mg bid.  - Repeat echo after about 6 months for ?ICD. Narrow QRS, not CRT candidate.  2. 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 FH of CAD, markedly high lipids, smoker.  - Will need ASA 81 long-term.  - Started on Crestor 40 mg daily.  Lipids/LFTs in 2 months.  3. Hyperlipidemia: LDL 260, HDL 14, TGs 625 (but 4792 when checked at [facility]). 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  - Started on Crestor 40 mg daily, may require PCSK9 inhibitor.  - Started Vascepa and fenofibrate => will use lower dose of fenofibrate  (45 mg daily) given small risk of interaction with Crestor.  - With suspected familial hyperlipidemia, I will go ahead and refer over to lipid clinic.  4. Acute pancreatitis: ?Due to ETOH vs elevated triglycerides vs both. Resolved.   5 Smoking: He has quit.  - continue nicotine patch 6. ETOH abuse:  He has quit, imperative to stay off ETOH as I suspect that it was a major contributor to his cardiomyopathy.   Followup in 2 wks with HF pharmacist for a couple of visits for med titration.  Then see me in 6 wks.   Marca Ancona 07/27/2019

## 2019-07-27 NOTE — Patient Instructions (Addendum)
START Coreg 3.125mg  ( 1 tab) twice a day  INCREASE Spironolactone to 25mg  (1 tab) at night  DECREASE Fenofibrate 48mg  ( 1 tab) daily  You have been referred to the Lipid Clinic.  They will call you to schedule this appointment.  Labs today and repeat in 2 weeks We will only contact you if something comes back abnormal or we need to make some changes. Otherwise no news is good news!  Your physician recommends that you schedule a follow-up appointment in: 2 weeks with the pharmacy clinic.  You will have 2-3 visits with pharmacy visit before you see Dr again.  Your physician recommends that you schedule a follow-up appointment in: 6 weeks with Dr .  Please call office at [phone] option [phone] if you have any questions or concerns.   At the [facility], you and your health needs are our priority. As part of our continuing mission to provide you with exceptional heart care, we have created designated Provider Care Teams. These Care Teams include your primary Cardiologist (physician) and Advanced Practice Providers (APPs- Physician Assistants and Nurse Practitioners) who all work together to provide you with the care you need, when you need it.   You may see any of the following providers on your designated Care Team at your next follow up: Shirlee Latch Dr [phone] . Dr Marland Kitchen . Arvilla Meres, NP . Marca Ancona, [place] . Tonye Becket, PharmD   Please be sure to bring in all your medications bottles to every appointment.

## 2019-07-31 ENCOUNTER — Other Ambulatory Visit: Payer: Self-pay

## 2019-07-31 ENCOUNTER — Encounter: Payer: Self-pay | Admitting: Pharmacist

## 2019-07-31 ENCOUNTER — Ambulatory Visit (INDEPENDENT_AMBULATORY_CARE_PROVIDER_SITE_OTHER): Payer: Commercial Managed Care - PPO | Admitting: Pharmacist

## 2019-07-31 DIAGNOSIS — E781 Pure hyperglyceridemia: Secondary | ICD-10-CM | POA: Diagnosis not present

## 2019-07-31 DIAGNOSIS — E785 Hyperlipidemia, unspecified: Secondary | ICD-10-CM

## 2019-07-31 NOTE — Patient Instructions (Signed)
It was a pleasure to meet you today!  I will call you in 6 weeks to coordinate lab work.  The two medications that we are considering are either Repatha or Praluent.  I will look into the cost of Vascepa for you and see if we can get it cheaper  Please call us at [phone] with any questions or concerns

## 2019-07-31 NOTE — Progress Notes (Signed)
Patient ID: Richard Valenzuela                 DOB: 12/22/1980                    MRN: [id number]     HPI: Richard Valenzuela is a 39 y.o. male patient referred to lipid clinic by Dr. Aundra Dubin. PMH is significant for ETOH abuse and smoking, nonobstructive CAD, and primarily nonischemic cardiomyopathy. Patient has a strong family history of CAD, both his mother and father had CAD diagnosed in their 44s.  He moved to [place] about a year ago from [place] for his job.  Initially, he was here without his family and started drinking quite heavily. He also has smoked chronically.   He was admitted in 12/20 with pleuritic chest pain and abdominal pain.  Amylase and lipase were high suggesting acute pancreatitis, due to ETOH but also with possible contribution from very high triglycerides (4000 range initially).  Echo was done, showing EF 25%. Patient was initially admitted to [facility], but at this point was transferred to [facility].  CMRI showed EF 10% with no LGE.  Cath showed low filling pressures and age-advanced CAD but no tight blockages. No arrhythmias were noted while in the hospital.  He was started slowly on cardiac meds (due to soft BP) and discharged home. Suspect familiar hyperlipidemia due to LDL >200  Patient presents today to the lipid clinic. He is tolerating cholesterol medication well. However Vascepa is not covered by his insurance. Used copay card, but was still ~$200. He is no longer drinking or smoking. He has been working on his diet. Has been trying to cut out sugar. Only has had a few sodas since discharge from hospital. Has been trying to drink water.  Patient wishes to get labs done at the wellness center at his work because it is free.  Current Medications: rosuvastatin 40mg  daily, fenofibrate 45mg  daily, Vascepa 2g twice a day Risk Factors: CAD, strong family history LDL goal: LDL<70, TG <150  Diet: very little soda, salad, using sugar free flavoring in water  Exercise:   Family History: Father with CAD at  age 29, mother with stents in her 52s.   Social History:  Married, works for a [employer], originally from [place], history of ETOH abuse and smoking.   Labs:  07/20/2019 TC 484, TG 625 (but 4792 when checked at [facility]) HDL 14 LDL D 259  Past Medical History:  Diagnosis Date  . Anginal pain ([place])     Current Outpatient Medications on File Prior to Visit  Medication Sig Dispense Refill  . acetaminophen (TYLENOL) 650 MG CR tablet Take 650 mg by mouth every 8 (eight) hours as needed for pain.    . carvedilol (COREG) 3.125 MG tablet Take 1 tablet (3.125 mg total) by mouth 2 (two) times daily. 180 tablet 3  . colchicine 0.6 MG tablet Take 1 tablet (0.6 mg total) by mouth daily. 30 tablet 0  . [facility] ASPIRIN ADULT LOW DOSE 81 MG chewable tablet Chew 1 tablet (81 mg total) by mouth daily. 30 tablet 2  . digoxin (LANOXIN) 0.125 MG tablet Take 1 tablet (0.125 mg total) by mouth daily. 30 tablet 2  . fenofibrate (TRICOR) 48 MG tablet Take 1 tablet (48 mg total) by mouth daily. 30 tablet 6  . folic acid (FOLVITE) 1 MG tablet Take 1 tablet (1 mg total) by mouth daily.    Marland Kitchen icosapent Ethyl (VASCEPA) 1 g capsule Take 2  capsules (2 g total) by mouth 2 (two) times daily. 120 capsule 2  . losartan (COZAAR) 25 MG tablet Take 0.5 tablets (12.5 mg total) by mouth 2 (two) times daily. 30 tablet 2  . Multiple Vitamin (MULTIVITAMIN WITH MINERALS) TABS tablet Take 1 tablet by mouth daily.    . nicotine (NICODERM CQ - DOSED IN MG/24 HOURS) 21 mg/24hr patch Place 1 patch (21 mg total) onto the skin daily. 28 patch 0  . pantoprazole (PROTONIX) 40 MG tablet Take 1 tablet (40 mg total) by mouth daily. 30 tablet 2  . rosuvastatin (CRESTOR) 40 MG tablet Take 1 tablet (40 mg total) by mouth daily at 6 PM. 30 tablet 2  . spironolactone (ALDACTONE) 25 MG tablet Take 1 tablet (25 mg total) by mouth at bedtime. 30 tablet 6  . thiamine 100 MG tablet Take 1 tablet (100 mg total) by mouth daily.     No current  facility-administered medications on file prior to visit.    No Known Allergies  Assessment/Plan:  1. Hyperlipidemia - LDL is above goal of <70 and TG above goal of <150. Patient recently started on rosuvastatin 40mg , fenofibrate 48mg  daily and Vascepea 2g twice a day. He also stopped drinking. Patient will most likely need PCSK9i for further reduction in LDL. However, insurance company will want to see reduction on statin prior to approval. Therefore will repeat labs in 8 weeks. If LDL still above goal will submit for PCSK9i. Patient agreeable to this plan. He was educated on injection technique and cardiovascular benefit. I will also call pharmacy to make sure they billed for generic Vascepa and make sure nothing has changed for 2021 formulary. If still not covered at a reasonable price will switch to Lovaza. Discussed importance of abstaining from alcohol and reducing carbs and sugars from the diet. Discussed healthy fats such as all natural peanut butter, nuts, avocados and lean meats. I will call patient in 6 weeks to coordinate labs. I have asked him to speak with the wellness center at work about how we can get the correct labs ordered and then sent to our office.   Thank you,  , Pharm.D, BCPS, CPP [facility]  [address], [place], [address] [place]  Phone: [phone]; Fax: [phone]

## 2019-08-10 ENCOUNTER — Other Ambulatory Visit: Payer: Self-pay

## 2019-08-10 ENCOUNTER — Ambulatory Visit (HOSPITAL_COMMUNITY)
Admission: RE | Admit: 2019-08-10 | Discharge: 2019-08-10 | Disposition: A | Discharge status: Home / Self Care | Payer: Commercial Managed Care - PPO | Source: Ambulatory Visit | Attending: Cardiology | Admitting: Cardiology

## 2019-08-10 DIAGNOSIS — I519 Heart disease, unspecified: Secondary | ICD-10-CM | POA: Insufficient documentation

## 2019-08-10 LAB — BASIC METABOLIC PANEL
Anion gap: 9 (ref 5–15)
BUN: 15 mg/dL (ref 6–20)
CO2: 26 mmol/L (ref 22–32)
Calcium: 9.5 mg/dL (ref 8.9–10.3)
Chloride: 103 mmol/L (ref 98–111)
Creatinine, Ser: 1.09 mg/dL (ref 0.61–1.24)
GFR calc Af Amer: >60 mL/min (ref >60)
GFR calc non Af Amer: >60 mL/min (ref >60)
Glucose, Bld: 107 mg/dL — ABNORMAL HIGH (ref 70–99)
Potassium: 4.3 mmol/L (ref 3.5–5.1)
Sodium: 138 mmol/L (ref 135–145)

## 2019-08-20 ENCOUNTER — Telehealth: Payer: Self-pay | Admitting: Pharmacist

## 2019-08-20 MED ORDER — OMEGA-3-ACID ETHYL ESTERS 1 G PO CAPS: 2.0000 g | ORAL_CAPSULE | Freq: Two times a day (BID) | ORAL | 11 refills | Status: DC

## 2019-08-20 NOTE — Telephone Encounter (Signed)
Patient is paying about $200 for Vascepa with copay card. Called insurance who states Lovaza is the preferred. Rx for Lovaza sent to pharmacy. Will call pharmacy for cost before calling patient. Generic lovaza is $95. Advertising account planner vascepa. Called patient and left voicemail to review.

## 2019-08-21 ENCOUNTER — Other Ambulatory Visit: Payer: Self-pay | Admitting: Pharmacist

## 2019-08-21 NOTE — Telephone Encounter (Signed)
Patient returned call. Provided number for work clinic to see if he can get labs done at clinic. [phone] He has appointment with Dr. Shirlee Latch on Feb 16 and would like to get his labs done at work clinic prior to that appointment.

## 2019-08-21 NOTE — Telephone Encounter (Signed)
Patient returned call. He found a coupon for generic Vascepa for around $100 and is ok paying this. He would like [place] to see if he can get labs at work clinic since they are free. # is [phone]. I will call and see if we can arrange. He has an appointment with Dr. Shirlee Latch on Feb 16 and would like to do labs before hand.

## 2019-08-24 NOTE — Telephone Encounter (Signed)
Spoke with [facility] employee clinic who states that patient can get labs drawn there. Just need to fax over orders to [phone] and include our fax number. Will fax over orders for lipid panel and CMP. Patient will get labs prior to appointment with Dr. Shirlee Latch.  Dr. Shirlee Latch is there any additional labs you may want?

## 2019-08-24 NOTE — Telephone Encounter (Signed)
He should also get BMET.

## 2019-08-25 NOTE — Telephone Encounter (Signed)
Orders faxed over for lipid panel and a complete metabolic panel to [phone] Called and left voicemail for patient to return call to advise of the above.

## 2019-08-30 ENCOUNTER — Encounter (HOSPITAL_COMMUNITY): Payer: Self-pay | Admitting: Nurse Practitioner

## 2019-08-30 ENCOUNTER — Emergency Department (HOSPITAL_COMMUNITY): Payer: Commercial Managed Care - PPO

## 2019-08-30 ENCOUNTER — Other Ambulatory Visit: Payer: Self-pay

## 2019-08-30 ENCOUNTER — Inpatient Hospital Stay (HOSPITAL_COMMUNITY)
Admission: EM | Admit: 2019-08-30 | Discharge: 2019-09-02 | DRG: 315 | Disposition: A | Discharge status: Home / Self Care | Payer: Commercial Managed Care - PPO | Attending: Cardiovascular Disease | Admitting: Cardiovascular Disease

## 2019-08-30 ENCOUNTER — Encounter (HOSPITAL_COMMUNITY): Payer: Self-pay | Admitting: *Deleted

## 2019-08-30 ENCOUNTER — Inpatient Hospital Stay (HOSPITAL_COMMUNITY)
Admission: EM | Disposition: A | Discharge status: Home / Self Care | Payer: Self-pay | Source: Home / Self Care | Attending: Cardiovascular Disease

## 2019-08-30 DIAGNOSIS — R0602 Shortness of breath: Secondary | ICD-10-CM

## 2019-08-30 DIAGNOSIS — Z8249 Family history of ischemic heart disease and other diseases of the circulatory system: Secondary | ICD-10-CM | POA: Diagnosis not present

## 2019-08-30 DIAGNOSIS — I3139 Other pericardial effusion (noninflammatory): Secondary | ICD-10-CM

## 2019-08-30 DIAGNOSIS — I509 Heart failure, unspecified: Secondary | ICD-10-CM

## 2019-08-30 DIAGNOSIS — Z7982 Long term (current) use of aspirin: Secondary | ICD-10-CM | POA: Diagnosis not present

## 2019-08-30 DIAGNOSIS — I2510 Atherosclerotic heart disease of native coronary artery without angina pectoris: Secondary | ICD-10-CM | POA: Diagnosis present

## 2019-08-30 DIAGNOSIS — Z8719 Personal history of other diseases of the digestive system: Secondary | ICD-10-CM

## 2019-08-30 DIAGNOSIS — D649 Anemia, unspecified: Secondary | ICD-10-CM | POA: Diagnosis present

## 2019-08-30 DIAGNOSIS — I308 Other forms of acute pericarditis: Principal | ICD-10-CM | POA: Diagnosis present

## 2019-08-30 DIAGNOSIS — I493 Ventricular premature depolarization: Secondary | ICD-10-CM | POA: Diagnosis present

## 2019-08-30 DIAGNOSIS — I314 Cardiac tamponade: Secondary | ICD-10-CM | POA: Diagnosis present

## 2019-08-30 DIAGNOSIS — E785 Hyperlipidemia, unspecified: Secondary | ICD-10-CM | POA: Diagnosis present

## 2019-08-30 DIAGNOSIS — I5022 Chronic systolic (congestive) heart failure: Secondary | ICD-10-CM | POA: Diagnosis present

## 2019-08-30 DIAGNOSIS — I426 Alcoholic cardiomyopathy: Secondary | ICD-10-CM | POA: Diagnosis present

## 2019-08-30 DIAGNOSIS — I313 Pericardial effusion (noninflammatory): Secondary | ICD-10-CM

## 2019-08-30 DIAGNOSIS — F1020 Alcohol dependence, uncomplicated: Secondary | ICD-10-CM | POA: Diagnosis present

## 2019-08-30 DIAGNOSIS — Z20822 Contact with and (suspected) exposure to covid-19: Secondary | ICD-10-CM | POA: Diagnosis present

## 2019-08-30 DIAGNOSIS — F17210 Nicotine dependence, cigarettes, uncomplicated: Secondary | ICD-10-CM | POA: Diagnosis present

## 2019-08-30 DIAGNOSIS — Z79899 Other long term (current) drug therapy: Secondary | ICD-10-CM

## 2019-08-30 DIAGNOSIS — R911 Solitary pulmonary nodule: Secondary | ICD-10-CM | POA: Diagnosis present

## 2019-08-30 HISTORY — PX: PERICARDIOCENTESIS: CATH118255

## 2019-08-30 LAB — CBC
HCT: 34.4 % — ABNORMAL LOW (ref 39.0–52.0)
Hemoglobin: 11.4 g/dL — ABNORMAL LOW (ref 13.0–17.0)
MCH: 32.4 pg (ref 26.0–34.0)
MCHC: 33.1 g/dL (ref 30.0–36.0)
MCV: 97.7 fL (ref 80.0–100.0)
Platelets: 238 10*3/uL (ref 150–400)
RBC: 3.52 MIL/uL — ABNORMAL LOW (ref 4.22–5.81)
RDW: 11.4 % — ABNORMAL LOW (ref 11.5–15.5)
WBC: 11.2 10*3/uL — ABNORMAL HIGH (ref 4.0–10.5)
nRBC: 0 % (ref 0.0–0.2)

## 2019-08-30 LAB — HEPATIC FUNCTION PANEL
ALT: 17 U/L (ref 0–44)
AST: 21 U/L (ref 15–41)
Albumin: 3.3 g/dL — ABNORMAL LOW (ref 3.5–5.0)
Alkaline Phosphatase: 59 U/L (ref 38–126)
Bilirubin, Direct: 0.3 mg/dL — ABNORMAL HIGH (ref 0.0–0.2)
Indirect Bilirubin: 0.9 mg/dL (ref 0.3–0.9)
Total Bilirubin: 1.2 mg/dL (ref 0.3–1.2)
Total Protein: 7.2 g/dL (ref 6.5–8.1)

## 2019-08-30 LAB — BASIC METABOLIC PANEL
Anion gap: 15 (ref 5–15)
BUN: 12 mg/dL (ref 6–20)
CO2: 21 mmol/L — ABNORMAL LOW (ref 22–32)
Calcium: 9 mg/dL (ref 8.9–10.3)
Chloride: 96 mmol/L — ABNORMAL LOW (ref 98–111)
Creatinine, Ser: 0.82 mg/dL (ref 0.61–1.24)
GFR calc Af Amer: >60 mL/min (ref >60)
GFR calc non Af Amer: >60 mL/min (ref >60)
Glucose, Bld: 127 mg/dL — ABNORMAL HIGH (ref 70–99)
Potassium: 3.7 mmol/L (ref 3.5–5.1)
Sodium: 132 mmol/L — ABNORMAL LOW (ref 135–145)

## 2019-08-30 LAB — TROPONIN I (HIGH SENSITIVITY)
Troponin I (High Sensitivity): 8 ng/L (ref <18)
Troponin I (High Sensitivity): 7 ng/L (ref <18)

## 2019-08-30 LAB — LIPASE, BLOOD: Lipase: 25 U/L (ref 11–51)

## 2019-08-30 LAB — RESPIRATORY PANEL BY RT PCR (FLU A&B, COVID)
Influenza A by PCR: NEGATIVE
Influenza B by PCR: NEGATIVE
SARS Coronavirus 2 by RT PCR: NEGATIVE

## 2019-08-30 LAB — BRAIN NATRIURETIC PEPTIDE: B Natriuretic Peptide: 55.7 pg/mL (ref 0.0–100.0)

## 2019-08-30 SURGERY — PERICARDIOCENTESIS
Anesthesia: LOCAL

## 2019-08-30 MED ORDER — HEPARIN (PORCINE) IN NACL 1000-0.9 UT/500ML-% IV SOLN
INTRAVENOUS | Status: AC
  Filled 2019-08-30: qty 1000

## 2019-08-30 MED ORDER — IOHEXOL 350 MG/ML SOLN
100.0000 mL | Freq: Once | INTRAVENOUS | Status: AC | PRN
  Administered 2019-08-30: 21:00:00 100 mL via INTRAVENOUS

## 2019-08-30 MED ORDER — SODIUM CHLORIDE 0.9% FLUSH
3.0000 mL | Freq: Once | INTRAVENOUS | Status: AC
  Administered 2019-08-30: 22:00:00 3 mL via INTRAVENOUS

## 2019-08-30 MED ORDER — MIDAZOLAM HCL 2 MG/2ML IJ SOLN
INTRAMUSCULAR | Status: AC
  Filled 2019-08-30: qty 2

## 2019-08-30 MED ORDER — LIDOCAINE HCL (PF) 1 % IJ SOLN
INTRAMUSCULAR | Status: AC
  Filled 2019-08-30: qty 30

## 2019-08-30 MED ORDER — LIDOCAINE HCL (PF) 1 % IJ SOLN
INTRAMUSCULAR | Status: DC | PRN
  Administered 2019-08-30: 20 mL via SUBCUTANEOUS

## 2019-08-30 MED ORDER — HEPARIN (PORCINE) IN NACL 1000-0.9 UT/500ML-% IV SOLN
INTRAVENOUS | Status: DC | PRN
  Administered 2019-08-30 (×2): 500 mL

## 2019-08-30 MED ORDER — FENTANYL CITRATE (PF) 100 MCG/2ML IJ SOLN
INTRAMUSCULAR | Status: AC
  Filled 2019-08-30: qty 2

## 2019-08-30 MED ORDER — MIDAZOLAM HCL 2 MG/2ML IJ SOLN
INTRAMUSCULAR | Status: DC | PRN
  Administered 2019-08-30: 2 mg via INTRAVENOUS
  Administered 2019-08-30: 1 mg via INTRAVENOUS
  Administered 2019-08-30: 2 mg via INTRAVENOUS

## 2019-08-30 MED ORDER — VERAPAMIL HCL 2.5 MG/ML IV SOLN
INTRAVENOUS | Status: AC
  Filled 2019-08-30: qty 2

## 2019-08-30 MED ORDER — SODIUM CHLORIDE 0.9 % IV SOLN
INTRAVENOUS | Status: AC | PRN
  Administered 2019-08-30: 10 mL/h via INTRAVENOUS

## 2019-08-30 MED ORDER — FENTANYL CITRATE (PF) 100 MCG/2ML IJ SOLN
50.0000 ug | Freq: Once | INTRAMUSCULAR | Status: AC
  Administered 2019-08-30: 50 ug via INTRAVENOUS
  Filled 2019-08-30: qty 2

## 2019-08-30 MED ORDER — FENTANYL CITRATE (PF) 100 MCG/2ML IJ SOLN
INTRAMUSCULAR | Status: DC | PRN
  Administered 2019-08-30: 50 ug via INTRAVENOUS
  Administered 2019-08-30 (×2): 25 ug via INTRAVENOUS

## 2019-08-30 SURGICAL SUPPLY — 5 items
KIT HEART LEFT (KITS) ×2 IMPLANT
PACK CARDIAC CATHETERIZATION (CUSTOM PROCEDURE TRAY) ×2 IMPLANT
PERIVAC PERICARDIOCENTESIS 8.3 (TRAY / TRAY PROCEDURE) ×2 IMPLANT
TRANSDUCER W/STOPCOCK (MISCELLANEOUS) ×2 IMPLANT
TUBING CIL FLEX 10 FLL-RA (TUBING) ×2 IMPLANT

## 2019-08-30 NOTE — H&P (Signed)
Cardiology Admission History and Physical:   Patient ID: [name] MRN: [id number]; DOB: 19-Apr-1981   Admission date: 08/30/2019  Primary Care Provider: Patient, No Pcp Per Primary Cardiologist: No primary care provider on file. Dr Marca Ancona Primary Electrophysiologist:  None   Chief Complaint:  Shortness of breath  Patient Profile:   Richard Valenzuela is a 39 y.o. 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 Richard Valenzuela was recently hospitalized in December 2020 after presenting with chest pain and suspected acute pancreatitis.  He was found to have new heart failure with severely reduced ejection fraction at that time.  He underwent right and left heart catheterization with filling pressures within the normal limits and cardiac index of 2.2.  A cardiac MRI revealed junction fraction of 10%, moderately decreased RV function, and no evidence of prior MI, myocarditis, or infiltrative disease.  Charged on losartan, digoxin, spironolactone.  The patient had been doing reasonably well in follow-up until about 3 days ago when he developed relatively acute onset shortness of breath.  He describes symptoms of orthopnea and severe exertional dyspnea that he had never experienced before.  He had not had any recent changes to his medication.  He denied any lower extremity edema or PND.  He had had no recent changes in his diet.  He does also report some chest pain that worsens with deep inspiration.  He denies any fevers or chills or recent sick contacts.  On arrival to the emergency department the patient was found to be in sinus tachycardia with heart rates in the 110s to 120s.  His blood pressure was normal at 120/89.  He was afebrile but tachypneic with respiratory rate around 23.  A chest x-ray revealed a small left pleural effusion and a PE protocol CT angiogram revealed a new moderate to large circumferential pericardial effusion.  Laboratory  studies were significant for sodium of 132, bicarb of 21, white blood cell count of 11, hematocrit 34.4, and Covid negative.  His troponin was normal.  A bedside ultrasound in the emergency department revealed a moderate sized pericardial effusion with a plethoric IVC and evidence of early tamponade physiology.  The Cath Lab was called in for emergent pericardiocentesis.  Heart Pathway Score:     Past Medical History:  Diagnosis Date  . Anginal pain [facility])     Past Surgical History:  Procedure Laterality Date  . RIGHT/LEFT HEART CATH AND CORONARY ANGIOGRAPHY N/A 07/21/2019   Procedure: RIGHT/LEFT HEART CATH AND CORONARY ANGIOGRAPHY;  Surgeon: Laurey Morale, MD;  Location: [facility] INVASIVE CV LAB;  Service: Cardiovascular;  Laterality: N/A;     Medications Prior to Admission: Prior to Admission medications   Medication Sig Start Date End Date Taking? Authorizing Provider  acetaminophen (TYLENOL) 650 MG CR tablet Take 650 mg by mouth every 8 (eight) hours as needed for pain.   Yes [provider]  carvedilol (COREG) 3.125 MG tablet Take 1 tablet (3.125 mg total) by mouth 2 (two) times daily. 07/27/19 10/25/19 Yes Laurey Morale, MD  [facility] ASPIRIN ADULT LOW DOSE 81 MG chewable tablet Chew 1 tablet (81 mg total) by mouth daily. 07/23/19  Yes Darlin Drop, DO  digoxin (LANOXIN) 0.125 MG tablet Take 1 tablet (0.125 mg total) by mouth daily. 07/24/19  Yes Hall, Carole N, DO  fenofibrate (TRICOR) 48 MG tablet Take 1 tablet (48 mg total) by mouth daily. 07/27/19  Yes Laurey Morale, MD  folic acid (FOLVITE) 800 MCG tablet Take 400 mcg by mouth daily.   Yes [provider]  icosapent Ethyl (VASCEPA) 1 g capsule Take 2 g by mouth 2 (two) times daily.   Yes [provider]  losartan (COZAAR) 25 MG tablet Take 0.5 tablets (12.5 mg total) by mouth 2 (two) times daily. 07/23/19  Yes Darlin Drop, DO  Multiple Vitamin (MULTIVITAMIN WITH MINERALS) TABS tablet Take 1 tablet by mouth  daily. 07/21/19  Yes Alwyn Ren, MD  nicotine (NICODERM CQ - DOSED IN MG/24 HOURS) 21 mg/24hr patch Place 1 patch (21 mg total) onto the skin daily. Patient taking differently: Place 21 mg onto the skin daily as needed (smoking cessation).  07/22/19  Yes Alwyn Ren, MD  pantoprazole (PROTONIX) 40 MG tablet Take 1 tablet (40 mg total) by mouth daily. 07/24/19  Yes Darlin Drop, DO  rosuvastatin (CRESTOR) 40 MG tablet Take 1 tablet (40 mg total) by mouth daily at 6 PM. 07/23/19  Yes Darlin Drop, DO  spironolactone (ALDACTONE) 25 MG tablet Take 1 tablet (25 mg total) by mouth at bedtime. 07/27/19  Yes Laurey Morale, MD  thiamine 250 MG tablet Take 125 mg by mouth daily.   Yes [provider]  colchicine 0.6 MG tablet Take 1 tablet (0.6 mg total) by mouth daily. Patient not taking: Reported on 08/30/2019 07/24/19   Darlin Drop, DO  folic acid (FOLVITE) 1 MG tablet Take 1 tablet (1 mg total) by mouth daily. Patient not taking: Reported on 08/30/2019 07/21/19   Alwyn Ren, MD  omega-3 acid ethyl esters (LOVAZA) 1 g capsule Take 2 capsules (2 g total) by mouth 2 (two) times daily. Patient not taking: Reported on 08/30/2019 08/20/19   Laurey Morale, MD  thiamine 100 MG tablet Take 1 tablet (100 mg total) by mouth daily. Patient not taking: Reported on 08/30/2019 07/21/19   Alwyn Ren, MD     Allergies:   No Known Allergies  Social History:   Social History   Socioeconomic History  . Marital status: Married    Spouse name: Not on file  . Number of children: Not on file  . Years of education: Not on file  . Highest education level: Not on file  Occupational History  . Not on file  Tobacco Use  . Smoking status: Former Smoker    Types: Cigarettes  . Smokeless tobacco: Never Used  Substance and Sexual Activity  . Alcohol use: Yes    Comment: 1 pint of liquor daily  . Drug use: Not on file  . Sexual activity: Not on file  Other Topics Concern    . Not on file  Social History Narrative  . Not on file   Social Determinants of Health   Financial Resource Strain:   . Difficulty of Paying Living Expenses: Not on file  Food Insecurity:   . Worried About Programme researcher, broadcasting/film/video in the Last Year: Not on file  . Ran Out of Food in the Last Year: Not on file  Transportation Needs:   . Lack of Transportation (Medical): Not on file  . Lack of Transportation (Non-Medical): Not on file  Physical Activity:   . Days of Exercise per Week: Not on file  . Minutes of Exercise per Session: Not on file  Stress:   . Feeling of Stress : Not on file  Social Connections:   . Frequency of Communication with Friends and Family: Not  on file  . Frequency of Social Gatherings with Friends and Family: Not on file  . Attends Religious Services: Not on file  . Active Member of Clubs or Organizations: Not on file  . Attends Banker Meetings: Not on file  . Marital Status: Not on file  Intimate Partner Violence:   . Fear of Current or Ex-Partner: Not on file  . Emotionally Abused: Not on file  . Physically Abused: Not on file  . Sexually Abused: Not on file    Family History:   The patient's family history includes Coronary artery disease (age of onset: 4) in his mother; Coronary artery disease (age of onset: 3) in his father.    ROS:  Please see the history of present illness.  All other ROS reviewed and negative.     Physical Exam/Data:   Vitals:   08/30/19 1924 08/30/19 2030 08/30/19 2045  BP: 128/77 134/89 (!) 123/92  Pulse: (!) 108 (!) 111 (!) 108  Resp: 18  16  Temp: 98.5 F (36.9 C)    TempSrc: Oral    SpO2: 100% 99% 98%   No intake or output data in the 24 hours ending 08/30/19 2145 Last 3 Weights 07/27/2019 07/23/2019 07/22/2019  Weight (lbs) 157 lb 152 lb 8 oz 150 lb 9.6 oz  Weight (kg) 71.215 kg 69.174 kg 68.312 kg     There is no height or weight on file to calculate BMI.  General:  Well nourished, well developed,   uncomfortable HEENT: normal Lymph: no adenopathy Neck: Difficult to evaluate JVP due to tachypnea Endocrine:  No thryomegaly Vascular: No carotid bruits; FA pulses 2+ bilaterally without bruits  Cardiac:  normal S1, S2; tachycardic, summation gallop, distant heart sounds Lungs: Tachypneic, crackles at the right lung base, no wheezing Abd: soft, nontender, no hepatomegaly  Ext: no edema Musculoskeletal:  No deformities, BUE and BLE strength normal and equal Skin: warm and dry  Neuro:  CNs 2-12 intact, no focal abnormalities noted Psych:  Normal affect    EKG:  The ECG that was done on arrival to the ED was personally reviewed and demonstrates sinus tachycardia, HR 109 bpm, normal axis, nonspecific ST and T wave abnormalities  Relevant CV Studies: Cardiac MRI from 07/22/19 IMPRESSION: 1.  Moderate LV dilation with EF 10%.  Diffuse hypokinesis.  2. Normal right ventricular size with mild to moderately decreased systolic function.  3. No myocardial LGE, so no definitive evidence for prior MI, myocarditis, or infiltrative disease.  R/L heart cath from 12/29: Diagnostic Dominance: Right Left Main  20% distal tapering.  Left Anterior Descending  Distal vessel narrowing without discrete stenosis. 40% ostial D1.  Ramus Intermedius  Moderate vessel with distal vessel tapering.  Left Circumflex  Large OM1 with luminal irregularities. Small AV LCx beyond OM1 with diffuse luminal irregularities.  Right Coronary Artery  Luminal irregularities.  Intervention  No interventions have been documented.  Right Heart Pressures R[facility] Procedural Findings: Hemodynamics (mmHg) RA mean 1 RV 14/2 PA 14/2 PCWP mean 2 LV 76/0 AO 83/61  Oxygen saturations: PA 63% AO 99%  Cardiac Output (Fick) 4.13  Cardiac Index (Fick) 2.21     Laboratory Data:  High Sensitivity Troponin:   Recent Labs  Lab 08/30/19 1932  TROPONINIHS 8      Chemistry Recent Labs  Lab 08/30/19 1932  NA  132*  K 3.7  CL 96*  CO2 21*  GLUCOSE 127*  BUN 12  CREATININE 0.82  CALCIUM 9.0  GFRNONAA >  60  GFRAA >60  ANIONGAP 15    Recent Labs  Lab 08/30/19 1725  PROT 7.2  ALBUMIN 3.3*  AST 21  ALT 17  ALKPHOS 59  BILITOT 1.2   Hematology Recent Labs  Lab 08/30/19 1932  WBC 11.2*  RBC 3.52*  HGB 11.4*  HCT 34.4*  MCV 97.7  MCH 32.4  MCHC 33.1  RDW 11.4*  PLT 238   BNP Recent Labs  Lab 08/30/19 1725  BNP 55.7    DDimer No results for input(s): DDIMER in the last 168 hours.   Radiology/Studies:  DG Chest 2 View  Result Date: 08/30/2019 CLINICAL DATA:  Chest pain EXAM: CHEST - 2 VIEW COMPARISON:  07/17/2019 FINDINGS: Small left pleural effusion. No confluent airspace opacities. Heart is normal size. No acute bony abnormality. Old healed right rib fractures. IMPRESSION: Small left pleural effusion. Electronically Signed   By: Rolm Baptise M.D.   On: 08/30/2019 20:23   CT Angio Chest PE W and/or Wo Contrast  Result Date: 08/30/2019 CLINICAL DATA:  Shortness of breath.  Left ventricular dysfunction. EXAM: CT ANGIOGRAPHY CHEST WITH CONTRAST TECHNIQUE: Multidetector CT imaging of the chest was performed using the standard protocol during bolus administration of intravenous contrast. Multiplanar CT image reconstructions and MIPs were obtained to evaluate the vascular anatomy. CONTRAST:  159mL OMNIPAQUE IOHEXOL 350 MG/ML SOLN COMPARISON:  07/17/2019 FINDINGS: Cardiovascular: The heart is enlarged. There is a moderate to large pericardial effusion which was not present on the previous study. There is coronary artery calcification. No aortic atherosclerotic calcification is seen. No pulmonary emboli. Mediastinum/Nodes: No mediastinal or hilar mass or lymphadenopathy. Lungs/Pleura: Pleural effusion on the left layering dependently with dependent pulmonary atelectasis. 8 mm pulmonary nodule in the superior segment of the right lower lobe is unchanged over the last 2 months. Early  emphysematous change at the lung apices. No other pulmonary parenchymal finding. Upper Abdomen: Negative Musculoskeletal: Healing anterior right rib fractures of the fourth and fifth ribs. Review of the MIP images confirms the above findings. IMPRESSION: New moderate to large pericardial effusion. Small left pleural effusion layering dependently. Dependent atelectasis on the left secondary to the effusion. No pulmonary emboli. Cardiomegaly.  Coronary artery calcification. Healing anterior rib fractures on the right of the fourth and fifth ribs. Electronically Signed   By: Nelson Chimes M.D.   On: 08/30/2019 21:08       Assessment and Plan:   Merlen Gurry is a 39 y.o. 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   #) SOB, Tamponade: new pericardial effusion with early tamponade physiology. Effusion accumulated quite quickly as it does not appear as this was present on MRI from just over 1 month ago.  Patient does not appear to be volume overloaded on exam. No signs of systemic infection.  - cath lab for emergent pericardiocentesis - fluid studies from pericardial fluid to rule out infectious, inflammatory etiologies - Thyroid studies - close monitoring in ICU - pericardial drain left in place - colchicine and ibuprofen  - limited bedside echo tomorrow to assess for fluid reaccumulation  #) HFrEF, EtOH cardiomyopathy - hold coreg, losartan, spironolactone for now given pre-tamponade - ok to continue home digoxin - likely ok to resume HF meds tomorrow  #) Hyperlipidemia - cont home crestor, fenofibrate, vascepa  #) Smoking history - nicotine replacement  Severity of Illness: The appropriate patient status for this patient is  INPATIENT. Inpatient status is judged to be reasonable and necessary in order to provide the required intensity of service to ensure the patient's safety. The  patient's presenting symptoms, physical exam findings, and initial radiographic and laboratory data in the context of their chronic comorbidities is felt to place them at high risk for further clinical deterioration. Furthermore, it is not anticipated that the patient will be medically stable for discharge from the hospital within 2 midnights of admission. The following factors support the patient status of inpatient.   " The patient's presenting symptoms include tachypnea, DOE. " The worrisome physical exam findings include pre-tamponade physiology. " The initial radiographic and laboratory data are worrisome because of pericardial effusion. " The chronic co-morbidities include HFrEF.   * I certify that at the point of admission it is my clinical judgment that the patient will require inpatient hospital care spanning beyond 2 midnights from the point of admission due to high intensity of service, high risk for further deterioration and high frequency of surveillance required.*    For questions or updates, please contact [facility] Please consult [email or web address] for contact info under        Signed, Rosario Jacks, MD  08/30/2019 9:45 PM

## 2019-08-30 NOTE — ED Provider Notes (Signed)
[facility] EMERGENCY DEPARTMENT Provider Note   CSN: [id number] Arrival date & time: 08/30/19  1920     History Chief Complaint  Patient presents with  . Chest Pain    Richard Valenzuela is a 39 y.o. 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  Patient was recently admitte for pancreatitis and also was noted to have alcoholic cardiomyopathy with a EF of 30% .  States that he is compliant with his medicines. He states that for the last week or so, he has been progressively having worsening shortness of breath.  States that it is pleuritic in nature.  He denies any abdominal pain and has definitely quit alcohol already.  Has no known Covid exposures.  Denies any leg swelling.  The history is provided by the patient.       Past Medical History:  Diagnosis Date  . Anginal pain [facility])     Patient Active Problem List   Diagnosis Date Noted  . Cardiac/pericardial tamponade 08/30/2019  . Hyperlipidemia 07/31/2019  . Systolic dysfunction 07/20/2019  . Alcoholic cardiomyopathy (HCC) [id number]/9629  . NSTEMI (non-ST elevated myocardial infarction) (HCC) 07/20/2019  . Tobacco abuse 07/20/2019  . Pancreatitis, acute 07/20/2019  . Hypertriglyceridemia 07/20/2019    Past Surgical History:  Procedure Laterality Date  . RIGHT/LEFT HEART CATH AND CORONARY ANGIOGRAPHY N/A 07/21/2019   Procedure: RIGHT/LEFT HEART CATH AND CORONARY ANGIOGRAPHY;  Surgeon: Laurey Morale, MD;  Location: [facility] INVASIVE CV LAB;  Service: Cardiovascular;  Laterality: N/A;       Family History  Problem Relation Age of Onset  . Coronary artery disease Mother 43       Multiple coronary stents  . Coronary artery disease Father 16       Quadruple bypass    Social History   Tobacco Use  . Smoking status: Former Smoker    Types: Cigarettes  . Smokeless tobacco: Never Used  Substance Use Topics  . Alcohol use: Yes    Comment: 1 pint of  liquor daily  . Drug use: Not on file    Home Medications Prior to Admission medications   Medication Sig Start Date End Date Taking? Authorizing Provider  acetaminophen (TYLENOL) 650 MG CR tablet Take 650 mg by mouth every 8 (eight) hours as needed for pain.   Yes [provider]  carvedilol (COREG) 3.125 MG tablet Take 1 tablet (3.125 mg total) by mouth 2 (two) times daily. 07/27/19 10/25/19 Yes Laurey Morale, MD  [facility] ASPIRIN ADULT LOW DOSE 81 MG chewable tablet Chew 1 tablet (81 mg total) by mouth daily. 07/23/19  Yes Darlin Drop, DO  digoxin (LANOXIN) 0.125 MG tablet Take 1 tablet (0.125 mg total) by mouth daily. 07/24/19  Yes Hall, Carole N, DO  fenofibrate (TRICOR) 48 MG tablet Take 1 tablet (48 mg total) by mouth daily. 07/27/19  Yes Laurey Morale, MD  folic acid (FOLVITE) 800 MCG tablet Take 400 mcg by mouth daily.   Yes [provider]  icosapent Ethyl (VASCEPA) 1 g capsule Take 2 g by mouth 2 (two) times daily.   Yes [provider]  losartan (COZAAR) 25 MG tablet Take 0.5 tablets (12.5 mg total) by mouth 2 (two) times daily. 07/23/19  Yes Darlin Drop, DO  Multiple Vitamin (MULTIVITAMIN WITH MINERALS) TABS tablet Take 1 tablet by mouth daily. 07/21/19  Yes Alwyn Ren, MD  nicotine (NICODERM CQ - DOSED IN MG/24 HOURS) 21  mg/24hr patch Place 1 patch (21 mg total) onto the skin daily. Patient taking differently: Place 21 mg onto the skin daily as needed (smoking cessation).  07/22/19  Yes Alwyn Ren, MD  pantoprazole (PROTONIX) 40 MG tablet Take 1 tablet (40 mg total) by mouth daily. 07/24/19  Yes Darlin Drop, DO  rosuvastatin (CRESTOR) 40 MG tablet Take 1 tablet (40 mg total) by mouth daily at 6 PM. 07/23/19  Yes Darlin Drop, DO  spironolactone (ALDACTONE) 25 MG tablet Take 1 tablet (25 mg total) by mouth at bedtime. 07/27/19  Yes Laurey Morale, MD  thiamine 250 MG tablet Take 125 mg by mouth daily.   Yes [provider]    colchicine 0.6 MG tablet Take 1 tablet (0.6 mg total) by mouth daily. Patient not taking: Reported on 08/30/2019 07/24/19   Darlin Drop, DO  folic acid (FOLVITE) 1 MG tablet Take 1 tablet (1 mg total) by mouth daily. Patient not taking: Reported on 08/30/2019 07/21/19   Alwyn Ren, MD  omega-3 acid ethyl esters (LOVAZA) 1 g capsule Take 2 capsules (2 g total) by mouth 2 (two) times daily. Patient not taking: Reported on 08/30/2019 08/20/19   Laurey Morale, MD  thiamine 100 MG tablet Take 1 tablet (100 mg total) by mouth daily. Patient not taking: Reported on 08/30/2019 07/21/19   Alwyn Ren, MD    Allergies    Patient has no known allergies.  Review of Systems   Review of Systems  Respiratory: Positive for shortness of breath.   Cardiovascular: Positive for chest pain.  All other systems reviewed and are negative.   Physical Exam Updated Vital Signs BP (!) 124/101   Pulse (!) 105   Temp 98.5 F (36.9 C) (Oral)   Resp 16   SpO2 100%   Physical Exam Vitals and nursing note reviewed.  Constitutional:      Appearance: He is well-developed.  HENT:     Head: Normocephalic.  Eyes:     Pupils: Pupils are equal, round, and reactive to light.  Cardiovascular:     Rate and Rhythm: Regular rhythm. Tachycardia present.     Heart sounds: Normal heart sounds.  Pulmonary:     Comments: Diminished bilateral bases  Abdominal:     General: Bowel sounds are normal.     Palpations: Abdomen is soft.  Musculoskeletal:        General: Normal range of motion.     Cervical back: Normal range of motion.  Skin:    General: Skin is warm.     Capillary Refill: Capillary refill takes less than 2 seconds.  Neurological:     General: No focal deficit present.     Mental Status: He is alert and oriented to person, place, and time.  Psychiatric:        Mood and Affect: Mood normal.        Behavior: Behavior normal.     ED Results / Procedures / Treatments   Labs (all labs  ordered are listed, but only abnormal results are displayed) Labs Reviewed  BASIC METABOLIC PANEL - Abnormal; Notable for the following components:      Result Value   Sodium 132 (*)    Chloride 96 (*)    CO2 21 (*)    Glucose, Bld 127 (*)    All other components within normal limits  CBC - Abnormal; Notable for the following components:   WBC 11.2 (*)  RBC 3.52 (*)    Hemoglobin 11.4 (*)    HCT 34.4 (*)    RDW 11.4 (*)    All other components within normal limits  HEPATIC FUNCTION PANEL - Abnormal; Notable for the following components:   Albumin 3.3 (*)    Bilirubin, Direct 0.3 (*)    All other components within normal limits  RESPIRATORY PANEL BY RT PCR (FLU A&B, COVID)  LIPASE, BLOOD  BRAIN NATRIURETIC PEPTIDE  TROPONIN I (HIGH SENSITIVITY)  TROPONIN I (HIGH SENSITIVITY)    EKG EKG Interpretation  Date/Time:  Sunday August 30 2019 19:23:32 EST Ventricular Rate:  109 PR Interval:  160 QRS Duration: 104 QT Interval:  326 QTC Calculation: 439 R Axis:   61 Text Interpretation: Sinus tachycardia Nonspecific T wave abnormality Abnormal ECG Since last tracing rate faster Confirmed by Wandra Arthurs [phone]) on 08/30/2019 8:04:39 PM   Radiology DG Chest 2 View  Result Date: 08/30/2019 CLINICAL DATA:  Chest pain EXAM: CHEST - 2 VIEW COMPARISON:  07/17/2019 FINDINGS: Small left pleural effusion. No confluent airspace opacities. Heart is normal size. No acute bony abnormality. Old healed right rib fractures. IMPRESSION: Small left pleural effusion. Electronically Signed   By: Rolm Baptise M.D.   On: 08/30/2019 20:23   CT Angio Chest PE W and/or Wo Contrast  Result Date: 08/30/2019 CLINICAL DATA:  Shortness of breath.  Left ventricular dysfunction. EXAM: CT ANGIOGRAPHY CHEST WITH CONTRAST TECHNIQUE: Multidetector CT imaging of the chest was performed using the standard protocol during bolus administration of intravenous contrast. Multiplanar CT image reconstructions and MIPs were  obtained to evaluate the vascular anatomy. CONTRAST:  131mL OMNIPAQUE IOHEXOL 350 MG/ML SOLN COMPARISON:  07/17/2019 FINDINGS: Cardiovascular: The heart is enlarged. There is a moderate to large pericardial effusion which was not present on the previous study. There is coronary artery calcification. No aortic atherosclerotic calcification is seen. No pulmonary emboli. Mediastinum/Nodes: No mediastinal or hilar mass or lymphadenopathy. Lungs/Pleura: Pleural effusion on the left layering dependently with dependent pulmonary atelectasis. 8 mm pulmonary nodule in the superior segment of the right lower lobe is unchanged over the last 2 months. Early emphysematous change at the lung apices. No other pulmonary parenchymal finding. Upper Abdomen: Negative Musculoskeletal: Healing anterior right rib fractures of the fourth and fifth ribs. Review of the MIP images confirms the above findings. IMPRESSION: New moderate to large pericardial effusion. Small left pleural effusion layering dependently. Dependent atelectasis on the left secondary to the effusion. No pulmonary emboli. Cardiomegaly.  Coronary artery calcification. Healing anterior rib fractures on the right of the fourth and fifth ribs. Electronically Signed   By: Nelson Chimes M.D.   On: 08/30/2019 21:08    Procedures Procedures (including critical care time)  CRITICAL CARE Performed by: Wandra Arthurs   Total critical care time: 30 minutes  Critical care time was exclusive of separately billable procedures and treating other patients.  Critical care was necessary to treat or prevent imminent or life-threatening deterioration.  Critical care was time spent personally by me on the following activities: development of treatment plan with patient and/or surrogate as well as nursing, discussions with consultants, evaluation of patient's response to treatment, examination of patient, obtaining history from patient or surrogate, ordering and performing  treatments and interventions, ordering and review of laboratory studies, ordering and review of radiographic studies, pulse oximetry and re-evaluation of patient's condition.     EMERGENCY DEPARTMENT [place] CARDIAC EXAM "Study: Limited Ultrasound of the Heart and Pericardium"  INDICATIONS:Abnormal vital  signs Multiple views of the heart and pericardium were obtained in real-time with a multi-frequency probe.  PERFORMED BS:WHQPRF IMAGES ARCHIVED?: Yes LIMITATIONS:  Body habitus VIEWS USED: Subcostal 4 chamber, Parasternal long axis and Parasternal short axis INTERPRETATION: Amount of pericardial effusion moderate, some RV collapse in diastole    Medications Ordered in ED Medications  sodium chloride flush (NS) 0.9 % injection 3 mL (3 mLs Intravenous Given 08/30/19 2154)  iohexol (OMNIPAQUE) 350 MG/ML injection 100 mL (100 mLs Intravenous Contrast Given 08/30/19 2102)  fentaNYL (SUBLIMAZE) injection 50 mcg (50 mcg Intravenous Given 08/30/19 2153)    ED Course  I have reviewed the triage vital signs and the nursing notes.  Pertinent labs & imaging results that were available during my care of the patient were reviewed by me and considered in my medical decision making (see chart for details).    MDM Rules/Calculators/A&P                     Richard Valenzuela is a 39 y.o. male here with shortness of breath.  He was recently admitted for cardiomyopathy and LVEF 25%.  Consider worsening cardiomyopathy with heart failure versus PE versus ACS.  Will get labs and CTA chest and BNP and troponin x2.   10: 15 pm Labs unremarkable. CT showed large pericardial effusion. Cardiology consulted and bedside echo showed large pericardial effusion.  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 Cardiology at bedside to see patient.   Final Clinical Impression(s) / ED Diagnoses Final diagnoses:  Pericardial effusion    Rx / DC Orders ED Discharge Orders    None       Charlynne Pander,  MD 08/30/19 2316

## 2019-08-30 NOTE — Progress Notes (Signed)
Chaplain responded to emergency page.  Chaplain offered support to Richard Valenzuela if he needs it.  Richard Valenzuela noted that he had been communicating with his family.   Chaplain will follow-up as needed.

## 2019-08-30 NOTE — ED Triage Notes (Signed)
Pt reporting onset of chest pain and upper right back pain. Some shortness of breath, no fevers. Worse to deep inspiration.

## 2019-08-31 ENCOUNTER — Inpatient Hospital Stay (HOSPITAL_COMMUNITY): Payer: Commercial Managed Care - PPO

## 2019-08-31 DIAGNOSIS — I5022 Chronic systolic (congestive) heart failure: Secondary | ICD-10-CM

## 2019-08-31 DIAGNOSIS — I314 Cardiac tamponade: Secondary | ICD-10-CM

## 2019-08-31 DIAGNOSIS — I313 Pericardial effusion (noninflammatory): Secondary | ICD-10-CM

## 2019-08-31 LAB — BODY FLUID CELL COUNT WITH DIFFERENTIAL
Eos, Fluid: 0 %
Lymphs, Fluid: 1 %
Monocyte-Macrophage-Serous Fluid: 18 % — ABNORMAL LOW (ref 50–90)
Neutrophil Count, Fluid: 81 % — ABNORMAL HIGH (ref 0–25)
Total Nucleated Cell Count, Fluid: 16730 cu mm — ABNORMAL HIGH (ref 0–1000)

## 2019-08-31 LAB — BASIC METABOLIC PANEL
Anion gap: 14 (ref 5–15)
BUN: 7 mg/dL (ref 6–20)
CO2: 25 mmol/L (ref 22–32)
Calcium: 8.9 mg/dL (ref 8.9–10.3)
Chloride: 93 mmol/L — ABNORMAL LOW (ref 98–111)
Creatinine, Ser: 0.78 mg/dL (ref 0.61–1.24)
GFR calc Af Amer: >60 mL/min (ref >60)
GFR calc non Af Amer: >60 mL/min (ref >60)
Glucose, Bld: 130 mg/dL — ABNORMAL HIGH (ref 70–99)
Potassium: 3.6 mmol/L (ref 3.5–5.1)
Sodium: 132 mmol/L — ABNORMAL LOW (ref 135–145)

## 2019-08-31 LAB — SEDIMENTATION RATE: Sed Rate: 70 mm/hr — ABNORMAL HIGH (ref 0–16)

## 2019-08-31 LAB — GRAM STAIN

## 2019-08-31 LAB — CBC
HCT: 31.8 % — ABNORMAL LOW (ref 39.0–52.0)
Hemoglobin: 10.9 g/dL — ABNORMAL LOW (ref 13.0–17.0)
MCH: 32.7 pg (ref 26.0–34.0)
MCHC: 34.3 g/dL (ref 30.0–36.0)
MCV: 95.5 fL (ref 80.0–100.0)
Platelets: 232 10*3/uL (ref 150–400)
RBC: 3.33 MIL/uL — ABNORMAL LOW (ref 4.22–5.81)
RDW: 11.5 % (ref 11.5–15.5)
WBC: 9.1 10*3/uL (ref 4.0–10.5)
nRBC: 0 % (ref 0.0–0.2)

## 2019-08-31 LAB — C-REACTIVE PROTEIN: CRP: 27.5 mg/dL — ABNORMAL HIGH (ref <1.0)

## 2019-08-31 LAB — MRSA PCR SCREENING: MRSA by PCR: NEGATIVE

## 2019-08-31 LAB — ECHOCARDIOGRAM COMPLETE

## 2019-08-31 LAB — TSH: TSH: 2.673 u[IU]/mL (ref 0.350–4.500)

## 2019-08-31 MED ORDER — ADULT MULTIVITAMIN W/MINERALS CH
1.0000 | ORAL_TABLET | Freq: Every day | ORAL | Status: DC
  Administered 2019-08-31 – 2019-09-02 (×3): 1 via ORAL
  Filled 2019-08-31 (×3): qty 1

## 2019-08-31 MED ORDER — ROSUVASTATIN CALCIUM 20 MG PO TABS
40.0000 mg | ORAL_TABLET | Freq: Every day | ORAL | Status: DC
  Administered 2019-08-31 – 2019-09-01 (×2): 40 mg via ORAL
  Filled 2019-08-31 (×2): qty 2

## 2019-08-31 MED ORDER — FENOFIBRATE 54 MG PO TABS
54.0000 mg | ORAL_TABLET | Freq: Every day | ORAL | Status: DC
  Administered 2019-08-31 – 2019-09-02 (×3): 54 mg via ORAL
  Filled 2019-08-31 (×3): qty 1

## 2019-08-31 MED ORDER — HEPARIN SODIUM (PORCINE) 5000 UNIT/ML IJ SOLN
5000.0000 [IU] | Freq: Three times a day (TID) | INTRAMUSCULAR | Status: DC
  Administered 2019-08-31 – 2019-09-02 (×6): 5000 [IU] via SUBCUTANEOUS
  Filled 2019-08-31 (×7): qty 1

## 2019-08-31 MED ORDER — CHLORHEXIDINE GLUCONATE CLOTH 2 % EX PADS
6.0000 | MEDICATED_PAD | Freq: Every day | CUTANEOUS | Status: DC
  Administered 2019-09-01: 6 via TOPICAL

## 2019-08-31 MED ORDER — ICOSAPENT ETHYL 1 G PO CAPS
2.0000 g | ORAL_CAPSULE | Freq: Two times a day (BID) | ORAL | Status: DC
  Administered 2019-08-31 – 2019-09-02 (×6): 2 g via ORAL
  Filled 2019-08-31 (×7): qty 2

## 2019-08-31 MED ORDER — DIGOXIN 125 MCG PO TABS
0.1250 mg | ORAL_TABLET | Freq: Every day | ORAL | Status: DC
  Administered 2019-08-31 – 2019-09-02 (×3): 0.125 mg via ORAL
  Filled 2019-08-31 (×3): qty 1

## 2019-08-31 MED ORDER — IBUPROFEN 200 MG PO TABS
600.0000 mg | ORAL_TABLET | Freq: Three times a day (TID) | ORAL | Status: DC
  Administered 2019-08-31: 600 mg via ORAL
  Filled 2019-08-31: qty 3

## 2019-08-31 MED ORDER — LOSARTAN POTASSIUM 25 MG PO TABS
12.5000 mg | ORAL_TABLET | Freq: Two times a day (BID) | ORAL | Status: DC
  Administered 2019-08-31 (×2): 12.5 mg via ORAL
  Filled 2019-08-31 (×2): qty 1

## 2019-08-31 MED ORDER — FOLIC ACID 1 MG PO TABS
1000.0000 ug | ORAL_TABLET | Freq: Every day | ORAL | Status: DC
  Administered 2019-08-31 – 2019-09-02 (×3): 1 mg via ORAL
  Filled 2019-08-31 (×3): qty 1

## 2019-08-31 MED ORDER — SPIRONOLACTONE 25 MG PO TABS
25.0000 mg | ORAL_TABLET | Freq: Every day | ORAL | Status: DC
  Administered 2019-08-31 – 2019-09-02 (×3): 25 mg via ORAL
  Filled 2019-08-31 (×3): qty 1

## 2019-08-31 MED ORDER — MORPHINE SULFATE (PF) 2 MG/ML IV SOLN
INTRAVENOUS | Status: AC
  Administered 2019-08-31: 4 mg via INTRAVENOUS
  Filled 2019-08-31: qty 1

## 2019-08-31 MED ORDER — CARVEDILOL 3.125 MG PO TABS
3.1250 mg | ORAL_TABLET | Freq: Two times a day (BID) | ORAL | Status: DC
  Administered 2019-08-31 – 2019-09-02 (×4): 3.125 mg via ORAL
  Filled 2019-08-31 (×4): qty 1

## 2019-08-31 MED ORDER — ASPIRIN 325 MG PO TABS
650.0000 mg | ORAL_TABLET | Freq: Three times a day (TID) | ORAL | Status: DC
  Administered 2019-08-31 – 2019-09-02 (×7): 650 mg via ORAL
  Filled 2019-08-31 (×7): qty 2

## 2019-08-31 MED ORDER — MORPHINE SULFATE (PF) 2 MG/ML IV SOLN
INTRAVENOUS | Status: AC
  Filled 2019-08-31: qty 1

## 2019-08-31 MED ORDER — TRAMADOL HCL 50 MG PO TABS
50.0000 mg | ORAL_TABLET | Freq: Two times a day (BID) | ORAL | Status: DC | PRN
  Administered 2019-08-31 (×2): 50 mg via ORAL
  Administered 2019-09-01 – 2019-09-02 (×3): 100 mg via ORAL
  Filled 2019-08-31 (×2): qty 2
  Filled 2019-08-31: qty 1
  Filled 2019-08-31 (×2): qty 2
  Filled 2019-08-31: qty 1

## 2019-08-31 MED ORDER — PANTOPRAZOLE SODIUM 40 MG PO TBEC
40.0000 mg | DELAYED_RELEASE_TABLET | Freq: Every day | ORAL | Status: DC
  Administered 2019-08-31 – 2019-09-01 (×2): 40 mg via ORAL
  Filled 2019-08-31 (×2): qty 1

## 2019-08-31 MED ORDER — MORPHINE SULFATE (PF) 2 MG/ML IV SOLN
2.0000 mg | INTRAVENOUS | Status: DC | PRN
  Administered 2019-08-31 (×3): 2 mg via INTRAVENOUS
  Administered 2019-08-31: 4 mg via INTRAVENOUS
  Administered 2019-08-31 – 2019-09-01 (×6): 2 mg via INTRAVENOUS
  Filled 2019-08-31 (×4): qty 1
  Filled 2019-08-31: qty 2
  Filled 2019-08-31 (×5): qty 1

## 2019-08-31 MED ORDER — VITAMIN B-1 50 MG PO TABS
125.0000 mg | ORAL_TABLET | Freq: Every day | ORAL | Status: DC
  Administered 2019-08-31 – 2019-09-01 (×2): 125 mg via ORAL
  Administered 2019-09-02: 100 mg via ORAL
  Filled 2019-08-31 (×3): qty 1

## 2019-08-31 MED ORDER — COLCHICINE 0.6 MG PO TABS
0.6000 mg | ORAL_TABLET | Freq: Every day | ORAL | Status: DC
  Administered 2019-08-31 – 2019-09-01 (×3): 0.6 mg via ORAL
  Filled 2019-08-31 (×3): qty 1

## 2019-08-31 NOTE — Consult Note (Addendum)
Advanced Heart Failure Team Consult Note   Primary Physician: Patient, No Pcp Per PCP-Cardiologist:  Dr. Aundra Dubin   Reason for Consultation: Heart Failure; Pericardial Effusion w/ Tamponade   HPI:    Richard Valenzuela is seen today for evaluation of chronic systolic heart failure and new pericardial effusion w/ tamponade at the request of Dr. Burt Knack, general cardiology.   39 y.o. male with history of ETOH abuse and smoking, nonobstructive CAD, and primarily nonischemic cardiomyopathy. Patient has a strong family history of CAD, both his mother and father had CAD diagnosed in their 63s.  He moved to [place] about a year ago from [place] for his job.  Initially, he was here without his family and started drinking quite heavily. He also has smoked chronically.   He was recently admitted in 12/20 with pleuritic chest pain and abdominal pain.  Amylase and lipase were high suggesting acute pancreatitis, due to ETOH but also with possible contribution from very high triglycerides (4000 range initially).  Echo was done, showing EF 25%. Patient was initially admitted to [facility], but transferred to [facility] for further w/u.  CMRI showed EF 10% with no LGE.  Cath showed low filling pressures and age-advanced CAD but no tight blockages. No arrhythmias were noted while in the hospital.  He was started slowly on cardiac meds (due to soft BP) and discharged home. On losartan, coreg, spiro and digoxin.   Pt presented to [facility] ED overnight w/ CC of SOB. Symptoms present x 3 days and progressively worsened. Worse w/ exertion. + orthopnea/ PND. Also w/ pleuritic CP. No fevers or chills but he reports recent exposure to sick contacts. He reports that his children were exposed to a COVID + individual ~ 2 weeks ago and he and his family quarantined. His children had mild "cold like" symptoms, but he denies any symptoms himself.    ED W/U: COVID negative. BNP WNL at 55. HS troponin neg x 2 (8>>7). CBC w/ mild leukocytosis, 11.2, mild anemia  w/ Hgb at 11.4 and c/w baseline. BMP w/ SCr 0.82, K 3.7, Na 132. CXR w/ small left pleural effusion. Chest CT negative for PE but showed new moderate to large pericardial effusion. He was symptomatic w/ severe pleuritic chest pain, tachycardia, and shortness of breath leading to urgent pericardiocentesis by Dr. Burt Knack. 400 cc of straw colored serous fluid  drained. Fluid analysis/ cyctology pending. CXR today demonstrates interval placement of pericardial catheter without evidence of complication. Unchanged trace left-sided effusion and associated left basilar opacities, likely atelectasis.   Now in CCU. Symptoms improved.  No resting dyspnea but still w/ pleuritic CP. Normotensive but remains tachycardic, HR 110s w/ PVCs. WBC down 11.2>>9.1. Afebrile. Started on colchicine + high dose ibuprofen. HF meds held on admit. Prior to admit, he had been compliant w/ his HF medications. Reports that he has cut down significantly on his ETOH intake. No longer smoking.     2D Echo [facility]) 07/18/19 Severely dilated LV, EF 25-30%. RV normal size and systolic function Mild TR  R/LHC 2/20  1. Low filling pressures.  2. Relatively preserved cardiac output.  3. Nonobstructive CAD but age-advanced.   Nonischemic cardiomyopathy.   2D Echo 2/21 Results pending   Review of Systems: [y] = yes, '[ ]'$  = no   . General: Weight gain '[ ]'$ ; Weight loss '[ ]'$ ; Anorexia '[ ]'$ ; Fatigue '[ ]'$ ; Fever '[ ]'$ ; Chills '[ ]'$ ; Weakness '[ ]'$   . Cardiac: Chest pain/pressure '[ ]'$ ; Resting SOB '[ ]'$ ;  Exertional SOB '[ ]'$ ; Orthopnea '[ ]'$ ; Pedal Edema '[ ]'$ ; Palpitations '[ ]'$ ; Syncope '[ ]'$ ; Presyncope '[ ]'$ ; Paroxysmal nocturnal dyspnea'[ ]'$   . Pulmonary: Cough '[ ]'$ ; Wheezing'[ ]'$ ; Hemoptysis'[ ]'$ ; Sputum '[ ]'$ ; Snoring '[ ]'$   . GI: Vomiting'[ ]'$ ; Dysphagia'[ ]'$ ; Melena'[ ]'$ ; Hematochezia '[ ]'$ ; Heartburn'[ ]'$ ; Abdominal pain '[ ]'$ ; Constipation '[ ]'$ ; Diarrhea '[ ]'$ ; BRBPR '[ ]'$   . GU: Hematuria'[ ]'$ ; Dysuria '[ ]'$ ; Nocturia'[ ]'$   . Vascular: Pain in legs with walking '[ ]'$ ; Pain  in feet with lying flat '[ ]'$ ; Non-healing sores '[ ]'$ ; Stroke '[ ]'$ ; TIA '[ ]'$ ; Slurred speech '[ ]'$ ;  . Neuro: Headaches'[ ]'$ ; Vertigo'[ ]'$ ; Seizures'[ ]'$ ; Paresthesias'[ ]'$ ;Blurred vision '[ ]'$ ; Diplopia '[ ]'$ ; Vision changes '[ ]'$   . Ortho/Skin: Arthritis '[ ]'$ ; Joint pain '[ ]'$ ; Muscle pain '[ ]'$ ; Joint swelling '[ ]'$ ; Back Pain '[ ]'$ ; Rash '[ ]'$   . Psych: Depression'[ ]'$ ; Anxiety'[ ]'$   . Heme: Bleeding problems '[ ]'$ ; Clotting disorders '[ ]'$ ; Anemia '[ ]'$   . Endocrine: Diabetes '[ ]'$ ; Thyroid dysfunction'[ ]'$   Home Medications Prior to Admission medications   Medication Sig Start Date End Date Taking? Authorizing Provider  acetaminophen (TYLENOL) 650 MG CR tablet Take 650 mg by mouth every 8 (eight) hours as needed for pain.   Yes [provider]  carvedilol (COREG) 3.125 MG tablet Take 1 tablet (3.125 mg total) by mouth 2 (two) times daily. 07/27/19 10/25/19 Yes Larey Dresser, MD  [employer] ASPIRIN ADULT LOW DOSE 81 MG chewable tablet Chew 1 tablet (81 mg total) by mouth daily. 07/23/19  Yes Kayleen Memos, DO  digoxin (LANOXIN) 0.125 MG tablet Take 1 tablet (0.125 mg total) by mouth daily. 07/24/19  Yes Hall, Carole N, DO  fenofibrate (TRICOR) 48 MG tablet Take 1 tablet (48 mg total) by mouth daily. 07/27/19  Yes Larey Dresser, MD  folic acid (FOLVITE) 932 MCG tablet Take 400 mcg by mouth daily.   Yes [provider]  icosapent Ethyl (VASCEPA) 1 g capsule Take 2 g by mouth 2 (two) times daily.   Yes [provider]  losartan (COZAAR) 25 MG tablet Take 0.5 tablets (12.5 mg total) by mouth 2 (two) times daily. 07/23/19  Yes Kayleen Memos, DO  Multiple Vitamin (MULTIVITAMIN WITH MINERALS) TABS tablet Take 1 tablet by mouth daily. 07/21/19  Yes Georgette Shell, MD  nicotine (NICODERM CQ - DOSED IN MG/24 HOURS) 21 mg/24hr patch Place 1 patch (21 mg total) onto the skin daily. Patient taking differently: Place 21 mg onto the skin daily as needed (smoking cessation).  07/22/19  Yes Georgette Shell, MD    pantoprazole (PROTONIX) 40 MG tablet Take 1 tablet (40 mg total) by mouth daily. 07/24/19  Yes Kayleen Memos, DO  rosuvastatin (CRESTOR) 40 MG tablet Take 1 tablet (40 mg total) by mouth daily at 6 PM. 07/23/19  Yes Kayleen Memos, DO  spironolactone (ALDACTONE) 25 MG tablet Take 1 tablet (25 mg total) by mouth at bedtime. 07/27/19  Yes Larey Dresser, MD  thiamine 250 MG tablet Take 125 mg by mouth daily.   Yes [provider]    Past Medical History: Past Medical History:  Diagnosis Date  . Anginal pain [facility])     Past Surgical History: Past Surgical History:  Procedure Laterality Date  . PERICARDIOCENTESIS N/A 08/30/2019   Procedure: PERICARDIOCENTESIS;  Surgeon: Sherren Mocha, MD;  Location: [facility];  Service: Cardiovascular;  Laterality: N/A;  . RIGHT/LEFT HEART CATH AND CORONARY ANGIOGRAPHY N/A 07/21/2019   Procedure: RIGHT/LEFT HEART CATH AND CORONARY ANGIOGRAPHY;  Surgeon: Larey Dresser, MD;  Location: [facility];  Service: Cardiovascular;  Laterality: N/A;    Family History: Family History  Problem Relation Age of Onset  . Coronary artery disease Mother 29       Multiple coronary stents  . Coronary artery disease Father 65       Quadruple bypass    Social History: Social History   Socioeconomic History  . Marital status: Married    Spouse name: Not on file  . Number of children: Not on file  . Years of education: Not on file  . Highest education level: Not on file  Occupational History  . Not on file  Tobacco Use  . Smoking status: Former Smoker    Types: Cigarettes  . Smokeless tobacco: Never Used  Substance and Sexual Activity  . Alcohol use: Yes    Comment: 1 pint of liquor daily  . Drug use: Not on file  . Sexual activity: Not on file  Other Topics Concern  . Not on file  Social History Narrative  . Not on file   Social Determinants of Health   Financial Resource Strain:   . Difficulty of Paying Living Expenses: Not  on file  Food Insecurity:   . Worried About Charity fundraiser in the Last Year: Not on file  . Ran Out of Food in the Last Year: Not on file  Transportation Needs:   . Lack of Transportation (Medical): Not on file  . Lack of Transportation (Non-Medical): Not on file  Physical Activity:   . Days of Exercise per Week: Not on file  . Minutes of Exercise per Session: Not on file  Stress:   . Feeling of Stress : Not on file  Social Connections:   . Frequency of Communication with Friends and Family: Not on file  . Frequency of Social Gatherings with Friends and Family: Not on file  . Attends Religious Services: Not on file  . Active Member of Clubs or Organizations: Not on file  . Attends Archivist Meetings: Not on file  . Marital Status: Not on file    Allergies:  No Known Allergies  Objective:    Vital Signs:   Temp:  [98.5 F (36.9 C)-98.7 F (37.1 C)] 98.5 F (36.9 C) (02/08 0340) Pulse Rate:  [52-118] 84 (02/08 0700) Resp:  [14-35] 14 (02/08 0700) BP: (120-159)/(72-115) 144/87 (02/08 0600) SpO2:  [93 %-100 %] 99 % (02/08 0600)    Weight change: There were no vitals filed for this visit.  Intake/Output:   Intake/Output Summary (Last 24 hours) at 08/31/2019 0750 Last data filed at 08/31/2019 0658 Gross per 24 hour  Intake 590 ml  Output 1225 ml  Net -635 ml      Physical Exam    General:  fatigue appearing young WM. No resp difficulty HEENT: normal Neck: supple. Elevated JVP to ear . Carotids 2+ bilat; no bruits. No lymphadenopathy or thyromegaly appreciated. Cor: PMI nondisplaced. Regular rhythm, tachy rate. No rubs, gallops or murmurs. Lungs: clear Abdomen: + subxiphoid paracentesis drain. soft, nontender, nondistended. No hepatosplenomegaly. No bruits or masses. Good bowel sounds. Extremities: no cyanosis, clubbing, rash, edema Neuro: alert & orientedx3, cranial nerves grossly intact. moves all 4 extremities w/o difficulty. Affect  pleasant   Telemetry   Sinus tach, low 100s-110s w/ PVCs  EKG  Sinus tachycardia 108 bpm   Labs   Basic Metabolic Panel: Recent Labs  Lab 08/30/19 1932 08/31/19 0249  NA 132* 132*  K 3.7 3.6  CL 96* 93*  CO2 21* 25  GLUCOSE 127* 130*  BUN 12 7  CREATININE 0.82 0.78  CALCIUM 9.0 8.9    Liver Function Tests: Recent Labs  Lab 08/30/19 1725  AST 21  ALT 17  ALKPHOS 59  BILITOT 1.2  PROT 7.2  ALBUMIN 3.3*   Recent Labs  Lab 08/30/19 1725  LIPASE 25   No results for input(s): AMMONIA in the last 168 hours.  CBC: Recent Labs  Lab 08/30/19 1932 08/31/19 0249  WBC 11.2* 9.1  HGB 11.4* 10.9*  HCT 34.4* 31.8*  MCV 97.7 95.5  PLT 238 232    Cardiac Enzymes: No results for input(s): CKTOTAL, CKMB, CKMBINDEX, TROPONINI in the last 168 hours.  BNP: BNP (last 3 results) Recent Labs    08/30/19 1725  BNP 55.7    ProBNP (last 3 results) No results for input(s): PROBNP in the last 8760 hours.   CBG: No results for input(s): GLUCAP in the last 168 hours.  Coagulation Studies: No results for input(s): LABPROT, INR in the last 72 hours.   Imaging   DG Chest 2 View  Result Date: 08/30/2019 CLINICAL DATA:  Chest pain EXAM: CHEST - 2 VIEW COMPARISON:  07/17/2019 FINDINGS: Small left pleural effusion. No confluent airspace opacities. Heart is normal size. No acute bony abnormality. Old healed right rib fractures. IMPRESSION: Small left pleural effusion. Electronically Signed   By: Rolm Baptise M.D.   On: 08/30/2019 20:23   CT Angio Chest PE W and/or Wo Contrast  Result Date: 08/30/2019 CLINICAL DATA:  Shortness of breath.  Left ventricular dysfunction. EXAM: CT ANGIOGRAPHY CHEST WITH CONTRAST TECHNIQUE: Multidetector CT imaging of the chest was performed using the standard protocol during bolus administration of intravenous contrast. Multiplanar CT image reconstructions and MIPs were obtained to evaluate the vascular anatomy. CONTRAST:  18m OMNIPAQUE  IOHEXOL 350 MG/ML SOLN COMPARISON:  07/17/2019 FINDINGS: Cardiovascular: The heart is enlarged. There is a moderate to large pericardial effusion which was not present on the previous study. There is coronary artery calcification. No aortic atherosclerotic calcification is seen. No pulmonary emboli. Mediastinum/Nodes: No mediastinal or hilar mass or lymphadenopathy. Lungs/Pleura: Pleural effusion on the left layering dependently with dependent pulmonary atelectasis. 8 mm pulmonary nodule in the superior segment of the right lower lobe is unchanged over the last 2 months. Early emphysematous change at the lung apices. No other pulmonary parenchymal finding. Upper Abdomen: Negative Musculoskeletal: Healing anterior right rib fractures of the fourth and fifth ribs. Review of the MIP images confirms the above findings. IMPRESSION: New moderate to large pericardial effusion. Small left pleural effusion layering dependently. Dependent atelectasis on the left secondary to the effusion. No pulmonary emboli. Cardiomegaly.  Coronary artery calcification. Healing anterior rib fractures on the right of the fourth and fifth ribs. Electronically Signed   By: MNelson ChimesM.D.   On: 08/30/2019 21:08   CARDIAC CATHETERIZATION  Result Date: 08/30/2019 Successful fluoroscopically guided pericardiocentesis Plan: echo in am. DC drain pending echo findings and rate of drainage.  Portable chest 1 View  Result Date: 08/31/2019 CLINICAL DATA:  Shortness of breath.  Pericardial effusion. EXAM: PORTABLE CHEST 1 VIEW COMPARISON:  08/30/2019; chest CT-08/30/2019 FINDINGS: Grossly unchanged enlarged cardiac silhouette, post placement of a pericardial drainage catheter. Unchanged mediastinal contours. Unchanged trace left-sided pleural effusion and associated left  basilar opacities. The right hemithorax remains well aerated. No new focal airspace opacities. No evidence of edema. Old right-sided sixth and seventh rib fractures. IMPRESSION:  1. Interval placement of pericardial catheter without evidence of complication. 2. Unchanged trace left-sided effusion and associated left basilar opacities, likely atelectasis. Electronically Signed   By: Sandi Mariscal M.D.   On: 08/31/2019 07:25      Medications:     Current Medications: . colchicine  0.6 mg Oral Daily  . digoxin  0.125 mg Oral Daily  . fenofibrate  54 mg Oral Daily  . folic acid  5,009 mcg Oral Daily  . heparin  5,000 Units Subcutaneous Q8H  . ibuprofen  600 mg Oral TID  . icosapent Ethyl  2 g Oral BID  . multivitamin with minerals  1 tablet Oral Daily  . pantoprazole  40 mg Oral Daily  . rosuvastatin  40 mg Oral q1800  . thiamine  125 mg Oral Daily     Infusions:       Assessment/Plan    1. Pericardial Effusion w/ Early Tamponade: Suspect complication of pericarditis. New moderate to large pericardial effusion noted on chest CT 2/7. He was symptomatic w/ severe pleuritic chest pain, tachycardia, and shortness of breath leading to urgent pericardiocentesis by Dr. Burt Knack. 400 cc of straw colored serous fluid drained. Fluid analysis pending. COVID negative.  - currently hemodynamically stable and WBC ct down trending. AF - subxiphoid drain remains in place - fluid cultures/ cytology pending - Check TSH  - Check ESR and CRP (Felt to have possible perimyocarditis12/20).  - Continue ibuprofen 600 tid x 2-4 weeks w/ PPI for GI protection  - Continue colchicine, 0.6 mg qd, x 3 months to reduce risk of recurrence  2. Chronic Systolic Heart Failure: Echo at [facility] in 12/20 with EF 25-30%, normal RV. Cardiolite showed EF 15% with fixed defect. LHC/RHC 12/20 showed nonobstructive coronary disease and low filling pressures, CI 2.2. Endorsed pleuritic chest pain around time of diagnosis, possibly consistent with perimyocarditishoweverhs troponin at the time was not elevatedand cRMI in 12/20 w/o evidence of myocarditis or infiltrative disease.Cardiomyopathy may  be related to heavy ETOH use.  - HF meds held on admit. BP stable and tachycardic - Plan to gradually resume home regimen:  Carvedilol 3.125 bid  Digoxin 0.125 mg  Losartan 12.5 bid  Spironolactone 25 qhs - Repeat Echo pending    3. CAD: Nonobstructive but age-advanced CAD on cath 12/20.+ FH of CAD, markedly high lipids + h/o tobacco use.  - + pleuritic CP but no symptoms of ischemic CP - continue statin.  - plan to resume  blocker, carvedilol 3.125 bid   3. Hyperlipidemia: recent LDL 260, HDL 14, TGs 625 (but 4792 when checked at [facility]). 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 He is being followed in [facility].  - Continue Crestor 40 mg daily, may require PCSK9 inhibitor at some point.  - Continue Vascepa and fenofibrate - Repeat FLP in the AM.   4.Smoking: reports he has quit.   5. H/o ETOH abuse:  reports that he has significantly reduced intake.    Length of Stay: 1  Brittainy Simmons, PA-C  08/31/2019, 7:50 AM  Advanced Heart Failure Team Pager [phone] (M-F; 7a - 4p)  Please contact [facility] for night-coverage after hours (4p -7a ) and weekends on [email or web address]  Patient seen with PA, agree with the above note.   Patient presented with pleuritic chest pain c/w acute pericarditis, bedside echo showed large pericardial effusion with early  tamponade.  He had pericardiocentesis yesterday, pericardial drain left in place.   This morning, still with pleuritic chest pain radiating to back but improved.   Echo with EF 20%, diffuse hypokinesis, mildly decreased RV systolic function, small pericardial effusion.    Pericardial drain put out around 150 cc overnight until this morning (pieced this number together as emptied several times and not fully recorded).    General: NAD Neck: No JVD, no thyromegaly or thyroid nodule.  Lungs: Clear to auscultation bilaterally with normal respiratory effort. CV: Nondisplaced PMI.  Heart regular S1/S2, no S3/S4, soft friction rub.  No  peripheral edema.  No carotid bruit.  Normal pedal pulses.  Abdomen: Soft, nontender, no hepatosplenomegaly, no distention.  Skin: Intact without lesions or rashes.  Neurologic: Alert and oriented x 3.  Psych: Normal affect. Extremities: No clubbing or cyanosis.  HEENT: Normal.   1. Acute pericarditis with pericardial effusion/early tamponade: Now s/p pericardiocentesis, drain is in place.  Fluid culture so far negative.  Afebrile with normal WBCs.  Cause uncertain, ?viral pericarditis.  There was some question of a pericarditis component at last admission, but colchicine was not continued at discharge due to issues with his pharmacy (and pericarditis diagnosis not thought to be likely). Echo today with small residual effusion around heart.  Still with pericardial symptoms, pleuritic CP, but improved.  BP stable.  - Will give ASA 650 mg every 8 hrs x 2 wks.  - colchicine 0.6 daily x 3 months.  - Check ESR, CRP, ANA.  - With drainage this morning, will leave drain in today, reassess for removal tomorrow.  2. Chronic systolic CHF: Echo at [facility] in 12/20 with EF 25-30%, normal RV. Cardiolite showed EF 15% with fixed defect. LHC/RHC 12/20 showed nonobstructive coronary disease and low filling pressures, CI 2.2.  cRMI in 12/20 w/o evidence of myocarditis or infiltrative disease, LVEF 10%.  Echo this admission with EF about 20% with mildly decreased RV systolic function.Cardiomyopathy may be related to heavy ETOH use. NYHA class II symptoms now.  Not volume overloaded on exam.  - Restart losartan 12.5 mg bid, if BP remains stable, will transition to [employer].   - Can hold off on diuretic for now.  - Continue digoxin 0.125 daily.  - Restart spironolactone 25 daily and Coreg 3.125 mg bid.   - Repeat echo after about 6 months for ?ICD. Narrow QRS, not CRT candidate.  2. 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 FH of CAD, markedly high lipids, smoker.  - Will need ASA 81 long-term. Using  high dose aspirin now for pericardial symptoms.  - Continue Crestor 40 mg daily.  3. Hyperlipidemia: LDL 260, HDL 14, TGs 625 (but 4792 when checked at [facility]). 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  - Started on Crestor 40 mg daily, may require PCSK9 inhibitor.  - Started Vascepa and fenofibrate => will use lower dose of fenofibrate (45 mg daily) given small risk of interaction with Crestor.  - Check lipids this admission.  4. Acute pancreatitis: ?Due to ETOH vs elevated triglycerides vs both, this was last admission.  5Smoking: He has quit.  6. ETOH abuse:  He has quit, imperative to stay off ETOH as I suspect that it was a major contributor to his cardiomyopathy.   Loralie Champagne 08/31/2019 12:11 PM

## 2019-08-31 NOTE — Progress Notes (Signed)
  Echocardiogram 2D Echocardiogram has been performed.  Burnard Hawthorne 08/31/2019, 9:21 AM

## 2019-09-01 LAB — CYTOLOGY - NON PAP

## 2019-09-01 LAB — CBC
HCT: 30.6 % — ABNORMAL LOW (ref 39.0–52.0)
Hemoglobin: 10.6 g/dL — ABNORMAL LOW (ref 13.0–17.0)
MCH: 33 pg (ref 26.0–34.0)
MCHC: 34.6 g/dL (ref 30.0–36.0)
MCV: 95.3 fL (ref 80.0–100.0)
Platelets: 249 10*3/uL (ref 150–400)
RBC: 3.21 MIL/uL — ABNORMAL LOW (ref 4.22–5.81)
RDW: 11.4 % — ABNORMAL LOW (ref 11.5–15.5)
WBC: 6.7 10*3/uL (ref 4.0–10.5)
nRBC: 0 % (ref 0.0–0.2)

## 2019-09-01 LAB — BASIC METABOLIC PANEL
Anion gap: 11 (ref 5–15)
BUN: 10 mg/dL (ref 6–20)
CO2: 28 mmol/L (ref 22–32)
Calcium: 8.6 mg/dL — ABNORMAL LOW (ref 8.9–10.3)
Chloride: 96 mmol/L — ABNORMAL LOW (ref 98–111)
Creatinine, Ser: 0.71 mg/dL (ref 0.61–1.24)
GFR calc Af Amer: >60 mL/min (ref >60)
GFR calc non Af Amer: >60 mL/min (ref >60)
Glucose, Bld: 105 mg/dL — ABNORMAL HIGH (ref 70–99)
Potassium: 4.2 mmol/L (ref 3.5–5.1)
Sodium: 135 mmol/L (ref 135–145)

## 2019-09-01 LAB — LIPID PANEL
Cholesterol: 97 mg/dL (ref 0–200)
HDL: 29 mg/dL — ABNORMAL LOW (ref >40)
LDL Cholesterol: 48 mg/dL (ref 0–99)
Total CHOL/HDL Ratio: 3.3 RATIO
Triglycerides: 102 mg/dL (ref <150)
VLDL: 20 mg/dL (ref 0–40)

## 2019-09-01 LAB — GLUCOSE, BODY FLUID OTHER: Glucose, Body Fluid Other: 49 mg/dL

## 2019-09-01 LAB — ANA: Anti Nuclear Antibody (ANA): NEGATIVE

## 2019-09-01 LAB — PROTEIN, BODY FLUID (OTHER): Total Protein, Body Fluid Other: 5.7 g/dL

## 2019-09-01 LAB — LD, BODY FLUID (OTHER): LD, Body Fluid: 947 IU/L

## 2019-09-01 MED ORDER — COLCHICINE 0.6 MG PO TABS
0.6000 mg | ORAL_TABLET | Freq: Two times a day (BID) | ORAL | Status: DC
  Administered 2019-09-01 – 2019-09-02 (×2): 0.6 mg via ORAL
  Filled 2019-09-01 (×2): qty 1

## 2019-09-01 MED ORDER — PANTOPRAZOLE SODIUM 40 MG PO TBEC
40.0000 mg | DELAYED_RELEASE_TABLET | Freq: Two times a day (BID) | ORAL | Status: DC
  Administered 2019-09-01 – 2019-09-02 (×2): 40 mg via ORAL
  Filled 2019-09-01 (×2): qty 1

## 2019-09-01 MED ORDER — SACUBITRIL-VALSARTAN 24-26 MG PO TABS
1.0000 | ORAL_TABLET | Freq: Two times a day (BID) | ORAL | Status: DC
  Administered 2019-09-01 – 2019-09-02 (×3): 1 via ORAL
  Filled 2019-09-01 (×4): qty 1

## 2019-09-01 MED ORDER — ACETAMINOPHEN 325 MG PO TABS
650.0000 mg | ORAL_TABLET | Freq: Four times a day (QID) | ORAL | Status: DC | PRN
  Administered 2019-09-01 – 2019-09-02 (×2): 650 mg via ORAL
  Filled 2019-09-01 (×2): qty 2

## 2019-09-01 MED FILL — Verapamil HCl IV Soln 2.5 MG/ML: INTRAVENOUS | Qty: 2 | Status: AC

## 2019-09-01 NOTE — Plan of Care (Signed)
  Problem: Education: Goal: Knowledge of General Education information will improve Description Including pain rating scale, medication(s)/side effects and non-pharmacologic comfort measures Outcome: Progressing   

## 2019-09-01 NOTE — Progress Notes (Addendum)
Advanced Heart Failure Rounding Note  PCP-Cardiologist: Dr. Aundra Dubin   Subjective:    39 y.o. malewith history of ETOH abuse and smoking, nonobstructive CAD, and primarily nonischemic cardiomyopathy admitted w/ acute pericarditis with pericardial effusion/early tamponade, s/p pericardicentesis 2/7.     Fluid analysis, GS and cytology pending.   Post procedural 2D echo w/ small pericardial effusion. EF 20%.    Subxiphoid drain remains in place. Additional 40 cc out yesterday (400 cc removed initially).   TSH nl.   Inflammatory markers elevated. CRP 28. ESR 70.   ANA pending.   On combination therapy, high dose ASA 650 mg q8h + colchicine 0.6 mg qd.  Leucocytosis resolved, WBC 11>>9>>7. AF.   Symptoms improving. Less pleuritic CP but still sharp bilateral post scapular pain. No dyspnea. VSS.  TGs much improved. LDL still high.   Lipid Panel     Component Value Date/Time   CHOL 97 09/01/2019 0331   TRIG 102 09/01/2019 0331   HDL 29 (L) 09/01/2019 0331   CHOLHDL 3.3 09/01/2019 0331   VLDL 20 09/01/2019 0331   LDLCALC 48 09/01/2019 0331   LDLDIRECT 259.8 (H) 07/20/2019 2208    Objective:   Weight Range:   There is no height or weight on file to calculate BMI.   Vital Signs:   Temp:  [97.8 F (36.6 C)-98.5 F (36.9 C)] 98.2 F (36.8 C) (02/09 0359) Pulse Rate:  [30-101] 74 (02/09 0500) Resp:  [9-24] 15 (02/09 0500) BP: (91-133)/(58-106) 104/78 (02/09 0500) SpO2:  [92 %-100 %] 97 % (02/09 0500)    Weight change: There were no vitals filed for this visit.  Intake/Output:   Intake/Output Summary (Last 24 hours) at 09/01/2019 0710 Last data filed at 09/01/2019 0352 Gross per 24 hour  Intake 500 ml  Output 715 ml  Net -215 ml      Physical Exam    General:  Well appearing young white male. No resp difficulty HEENT: Normal Neck: Supple. JVP . Carotids 2+ bilat; no bruits. No lymphadenopathy or thyromegaly appreciated. Cor: PMI nondisplaced. Regular  rhythm, mildly tachy rate. No rubs, gallops or murmurs. + subxiphoid chest tube drain w/ straw colored serous fluid.  Lungs: Clear Abdomen: Soft, nontender, nondistended. No hepatosplenomegaly. No bruits or masses. Good bowel sounds. Extremities: No cyanosis, clubbing, rash, edema Neuro: Alert & orientedx3, cranial nerves grossly intact. moves all 4 extremities w/o difficulty. Affect pleasant   Telemetry   Sinus tach, low 100s. Few PVCs.   EKG    No new EKG to review.   Labs    CBC Recent Labs    08/31/19 0249 09/01/19 0331  WBC 9.1 6.7  HGB 10.9* 10.6*  HCT 31.8* 30.6*  MCV 95.5 95.3  PLT 232 409   Basic Metabolic Panel Recent Labs    08/31/19 0249 09/01/19 0331  NA 132* 135  K 3.6 4.2  CL 93* 96*  CO2 25 28  GLUCOSE 130* 105*  BUN 7 10  CREATININE 0.78 0.71  CALCIUM 8.9 8.6*   Liver Function Tests Recent Labs    08/30/19 1725  AST 21  ALT 17  ALKPHOS 59  BILITOT 1.2  PROT 7.2  ALBUMIN 3.3*   Recent Labs    08/30/19 1725  LIPASE 25   Cardiac Enzymes No results for input(s): CKTOTAL, CKMB, CKMBINDEX, TROPONINI in the last 72 hours.  BNP: BNP (last 3 results) Recent Labs    08/30/19 1725  BNP 55.7    ProBNP (last 3 results)  No results for input(s): PROBNP in the last 8760 hours.   D-Dimer No results for input(s): DDIMER in the last 72 hours. Hemoglobin A1C No results for input(s): HGBA1C in the last 72 hours. Fasting Lipid Panel Recent Labs    09/01/19 0331  CHOL 97  HDL 29*  LDLCALC 48  TRIG 102  CHOLHDL 3.3   Thyroid Function Tests Recent Labs    08/31/19 0957  TSH 2.673    Other results:   Imaging    ECHOCARDIOGRAM COMPLETE  Result Date: 08/31/2019    ECHOCARDIOGRAM REPORT   Patient Name:   Justinian Kaelin Date of Exam: 08/31/2019 Medical Rec #:  [id number]   Height:       70.0 in Accession #:    [id number]  Weight:       157.0 lb Date of Birth:  29-May-1981  BSA:          1.88 m Patient Age:    89 years    BP:            127/81 mmHg Patient Gender: M           HR:           94 bpm. Exam Location:  Inpatient Procedure: 2D Echo, Cardiac Doppler and Color Doppler Indications:    Pericardial effusion 423.9  History:        Patient has no prior history of Echocardiogram examinations.                 Cardiomyopathy, Arrythmias:non-specific ST changes; Risk                 Factors:Dyslipidemia and Former Smoker.  Sonographer:    Vickie Epley RDCS Referring Phys: [phone] [place]  1. Left ventricular ejection fraction, by estimation, is 20%. The left ventricle has severely decreased function. The left ventrical demonstrates global hypokinesis. The left ventricular internal cavity size was mildly dilated. Left ventricular diastolic parameters are consistent with Grade I diastolic dysfunction (impaired relaxation).  2. Right ventricular systolic function is mildly reduced. Normal RV size.  3. The aortic valve is tricuspid. No stenosis or regurgitation.  4. No significant mitral regurgitation.  5. No complete TR doppler jet so unable to estimate PA systolic pressure. Estimated RA pressure 8 mmHg.  6. Small circumferential pericardial effusion. No evidence for tamponade. FINDINGS  Left Ventricle: Left ventricular ejection fraction, by estimation, is 20%. The left ventricle has severely decreased function. The left ventricle demonstrates global hypokinesis. The left ventricular internal cavity size was mildly dilated. There is no left ventricular hypertrophy. Right Ventricle: The right ventricular size is normal. No increase in right ventricular wall thickness. Right ventricular systolic function is mildly reduced. Tricuspid regurgitation signal is inadequate for assessing RVSP. Left Atrium: Left atrial size was normal in size. Right Atrium: Right atrial size was normal in size. Pericardium: A small pericardial effusion is present. Mitral Valve: The mitral valve is normal in structure and function. No evidence of mitral valve  regurgitation. No evidence of mitral valve stenosis. Tricuspid Valve: The tricuspid valve is normal in structure. Tricuspid valve regurgitation is not demonstrated. Aortic Valve: The aortic valve is tricuspid. Aortic valve regurgitation is not visualized. No aortic stenosis is present. Pulmonic Valve: The pulmonic valve was normal in structure. Pulmonic valve regurgitation is not visualized. Aorta: The aortic root and ascending aorta are structurally normal, with no evidence of dilitation. Venous: The inferior vena cava is dilated in size with greater than 50% respiratory variability,  suggesting right atrial pressure of 8 mmHg. IAS/Shunts: No atrial level shunt detected by color flow Doppler.  LEFT VENTRICLE PLAX 2D LVIDd:         5.91 cm       Diastology LVIDs:         5.01 cm       LV e' lateral:   5.51 cm/s LV PW:         0.69 cm       LV E/e' lateral: 15.3 LV IVS:        0.69 cm       LV e' medial:    5.27 cm/s LVOT diam:     2.00 cm       LV E/e' medial:  16.0 LV SV:         40.84 ml LV SV Index:   29.37 LVOT Area:     3.14 cm  LV Volumes (MOD) LV area d, A2C:    55.00 cm LV area d, A4C:    63.90 cm LV area s, A2C:    46.20 cm LV area s, A4C:    50.10 cm LV major d, A2C:   10.40 cm LV major d, A4C:   11.00 cm LV major s, A2C:   9.74 cm LV major s, A4C:   10.30 cm LV vol d, MOD A2C: 242.0 ml LV vol d, MOD A4C: 310.0 ml LV vol s, MOD A2C: 183.0 ml LV vol s, MOD A4C: 207.0 ml LV SV MOD A2C:     59.0 ml LV SV MOD A4C:     310.0 ml LV SV MOD BP:      81.2 ml RIGHT VENTRICLE RV S prime:     8.60 cm/s TAPSE (M-mode): 1.5 cm LEFT ATRIUM             Index       RIGHT ATRIUM           Index LA diam:        3.90 cm 2.07 cm/m  RA Area:     15.00 cm LA Vol (A2C):   43.6 ml 23.15 ml/m RA Volume:   39.40 ml  20.92 ml/m LA Vol (A4C):   55.1 ml 29.26 ml/m LA Biplane Vol: 54.3 ml 28.83 ml/m  AORTIC VALVE LVOT Vmax:   96.10 cm/s LVOT Vmean:  68.300 cm/s LVOT VTI:    0.130 m  AORTA Ao Root diam: 3.40 cm MITRAL VALVE MV  Area (PHT): 3.54 cm             SHUNTS MV Decel Time: 214 msec             Systemic VTI:  0.13 m MV E velocity: 84.40 cm/s 103 cm/s  Systemic Diam: 2.00 cm MV A velocity: 89.20 cm/s 70.3 cm/s MV E/A ratio:  0.95       1.5 Loralie Champagne MD Electronically signed by Loralie Champagne MD Signature Date/Time: 08/31/2019/10:33:37 AM    Final       Medications:     Scheduled Medications: . aspirin  650 mg Oral Q8H  . carvedilol  3.125 mg Oral BID WC  . Chlorhexidine Gluconate Cloth  6 each Topical Daily  . colchicine  0.6 mg Oral Daily  . digoxin  0.125 mg Oral Daily  . fenofibrate  54 mg Oral Daily  . folic acid  9,562 mcg Oral Daily  . heparin  5,000 Units Subcutaneous Q8H  . icosapent Ethyl  2 g Oral BID  .  losartan  12.5 mg Oral BID  . multivitamin with minerals  1 tablet Oral Daily  . pantoprazole  40 mg Oral Daily  . rosuvastatin  40 mg Oral q1800  . spironolactone  25 mg Oral Daily  . thiamine  125 mg Oral Daily     Infusions:   PRN Medications:  morphine injection, traMADol    Assessment/Plan   1. Acute pericarditis with pericardial effusion/early tamponade: Now s/p pericardiocentesis, drain is in place.  Fluid culture so far negative.  Afebrile with normal WBCs. Inflammatory markers elevated,  CRP 28. ESR 70. ANA pending. Cause uncertain, ?viral pericarditis.  There was some question of a pericarditis component at last admission, but colchicine was not continued at discharge due to issues with his pharmacy (and pericarditis diagnosis not thought to be likely). Post procedural echo with small residual effusion around heart.  Still with pericardial symptoms, pleuritic CP/ scapular pan, but improved.  BP stable.  - Continue combination therapy w/ NSAIDs + colchicine  - Will give ASA 650 mg every 8 hrs x 2 wks.  - colchicine 0.6 daily x 3 months.  - Follow CRP and ERS in outpatient f/u to help guide ASA taper  - Plan to remove drain today 2.Chronicsystolic CHF: Echo at [facility]  in 12/20with EF 25-30%, normal RV. Cardiolite showed EF 15% with fixed defect. LHC/RHC 12/20showed nonobstructive coronary disease and low filling pressures, CI 2.2.  cRMI in 12/20w/o evidence of myocarditis or infiltrative disease, LVEF 10%.  Echo this admission with EF about 20% with mildly decreased RV systolic function.Cardiomyopathymaybe related to heavy ETOH use. NYHA class II symptoms now. Not volume overloaded on exam.  - Will try to make transition to Entresto 24/26 bid today.  - Can hold off on diuretic for now.  - Continue digoxin 0.125 daily.  - Continue spironolactone 25 daily  - Continue Coreg 3.125 mg bid.   - Repeat echo after about 6 months for ?ICD. Narrow QRS, not CRT candidate. 2. 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 FH of CAD, markedly high lipids, smoker.  - Will need ASA 81 long-term. Using high dose aspirin now for pericardial symptoms.  - Continue Crestor 40 mg daily. 3. Hyperlipidemia: LDL 260, HDL 14, TGs 625 (but 4792 when checked at [facility]). 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  - Started on Crestor 40 mg daily 12/20 - Started Vascepa and fenofibrate=>will use lower dose of fenofibrate (45 mg daily) given small risk of interaction with Crestor.  - TGs improved, LDL down to 48.   4. Acute pancreatitis: ?Due to ETOH vs elevated triglycerides vs both, this was last admission. 5Smoking:He has quit. 6. ETOH abuse:He has quit, imperative to stay off ETOH as I suspect that it was a major contributor to his cardiomyopathy.  Will try to get med cost assistance for colchicine. Will discuss options w/ pharmacy. Check good Rx cost.  Length of Stay: 2  Lyda Jester, PA-C  09/01/2019, 7:10 AM  Advanced Heart Failure Team Pager [phone] (M-F; 7a - 4p)  Please contact [facility] for night-coverage after hours (4p -7a ) and weekends on [email or web address]  Patient seen with PA, agree with the above note.   Patient had some pericardial fluid drainage  yesterday morning, none overnight.  Still with mild pleuritic chest pain radiating to his shoulder.  No dyspnea.   General: NAD Neck: No JVD, no thyromegaly or thyroid nodule.  Lungs: Clear to auscultation bilaterally with normal respiratory effort. CV: Nondisplaced PMI.  Heart regular S1/S2, no S3/S4, no murmur  or rub heard this morning.  No peripheral edema.   Abdomen: Soft, nontender, no hepatosplenomegaly, no distention.  Skin: Intact without lesions or rashes.  Neurologic: Alert and oriented x 3.  Psych: Normal affect. Extremities: No clubbing or cyanosis.  HEENT: Normal.   Pericardial drain pulled today.  Will repeat echo tomorrow to make sure no significant fluid accumulation.  Continue colchicine + high dose aspirin.   Echo yesterday with EF still low at 20%.  Will stop losartan today and start Entresto 24/26 bid.   Lipids markedly improved.   Loralie Champagne 09/01/2019 8:15 AM

## 2019-09-02 ENCOUNTER — Inpatient Hospital Stay (HOSPITAL_COMMUNITY): Payer: Commercial Managed Care - PPO

## 2019-09-02 DIAGNOSIS — I313 Pericardial effusion (noninflammatory): Secondary | ICD-10-CM

## 2019-09-02 LAB — CBC
HCT: 33.4 % — ABNORMAL LOW (ref 39.0–52.0)
Hemoglobin: 11.5 g/dL — ABNORMAL LOW (ref 13.0–17.0)
MCH: 32.3 pg (ref 26.0–34.0)
MCHC: 34.4 g/dL (ref 30.0–36.0)
MCV: 93.8 fL (ref 80.0–100.0)
Platelets: 306 10*3/uL (ref 150–400)
RBC: 3.56 MIL/uL — ABNORMAL LOW (ref 4.22–5.81)
RDW: 11.4 % — ABNORMAL LOW (ref 11.5–15.5)
WBC: 6.1 10*3/uL (ref 4.0–10.5)
nRBC: 0 % (ref 0.0–0.2)

## 2019-09-02 LAB — BASIC METABOLIC PANEL
Anion gap: 9 (ref 5–15)
BUN: 10 mg/dL (ref 6–20)
CO2: 27 mmol/L (ref 22–32)
Calcium: 8.8 mg/dL — ABNORMAL LOW (ref 8.9–10.3)
Chloride: 100 mmol/L (ref 98–111)
Creatinine, Ser: 0.72 mg/dL (ref 0.61–1.24)
GFR calc Af Amer: >60 mL/min (ref >60)
GFR calc non Af Amer: >60 mL/min (ref >60)
Glucose, Bld: 100 mg/dL — ABNORMAL HIGH (ref 70–99)
Potassium: 4.3 mmol/L (ref 3.5–5.1)
Sodium: 136 mmol/L (ref 135–145)

## 2019-09-02 LAB — ECHOCARDIOGRAM LIMITED
Height: 70 in
Weight: 2542.4 oz

## 2019-09-02 LAB — PH, BODY FLUID: pH, Body Fluid: 7.2

## 2019-09-02 MED ORDER — SACUBITRIL-VALSARTAN 24-26 MG PO TABS: 1.0000 | ORAL_TABLET | Freq: Two times a day (BID) | ORAL | 3 refills | Status: DC

## 2019-09-02 MED ORDER — ASPIRIN 325 MG PO TABS: 650.0000 mg | ORAL_TABLET | Freq: Three times a day (TID) | ORAL | 0 refills | Status: DC

## 2019-09-02 MED ORDER — PANTOPRAZOLE SODIUM 40 MG PO TBEC: 40.0000 mg | DELAYED_RELEASE_TABLET | Freq: Two times a day (BID) | ORAL | 0 refills | Status: DC

## 2019-09-02 MED ORDER — COLCHICINE 0.6 MG PO TABS: 0.6000 mg | ORAL_TABLET | Freq: Two times a day (BID) | ORAL | 3 refills | Status: DC

## 2019-09-02 NOTE — Care Management (Signed)
Per [facility] help [phone]  Co-pay amount for Entresto 24-26mg .. $35.00.  Colchicine 0.6 mg. 2 a day.not on formulary PA required [phone].   PA not required for Entresto No Deductible Pharmacy : [facility]. [facility] ?

## 2019-09-02 NOTE — Care Management (Signed)
1300 09-02-19 Patient will need prior authorization completed for Colchicine. Case Manager did reach out to Heart Failure team to make them aware to call the prior authorization. The pharmacist provided the patient with the 30 day free Entresto card. No further needs at this time. Gala Lewandowsky, RN,BSN Case Manager

## 2019-09-02 NOTE — Discharge Summary (Signed)
Advanced Heart Failure Team  Discharge Summary   Patient ID: Richard Valenzuela MRN: [id number], DOB/AGE: [birth year] 39 y.o. 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 malewith history of ETOH abuse and smoking, nonobstructive CAD, and primarily nonischemic cardiomyopathy.Patient has a strong family history of CAD, both his mother and father had CAD diagnosed in their 35s. He moved to [place] about a year ago from [place] for his job. Initially, he was here without his family and started drinking quite heavily. He also has smoked chronically. He was recently admitted in 12/20 with pleuritic chest pain and abdominal pain. Amylase and lipase were high suggesting acute pancreatitis, due to ETOH but also with possible contribution from very high triglycerides (4000 range initially). Echo was done, showing EF 25%. Patient was initially admitted to [facility], but transferred to [facility] for further w/u. CMRI showed EF 10% with no LGE. Cath showed low filling pressures and age-advanced CAD but no tight blockages. No arrhythmias were noted while in the hospital. He was started slowly on cardiac meds (due to soft BP) and discharged home on losartan, coreg, spiro and digoxin.   Pt presented to [facility] ED on 08/30/19 w/ CC of SOB. Symptoms present x 3 days and progressively worsened. Worse w/ exertion. + orthopnea/ PND. Also w/ pleuritic CP. No fevers or chills but he reported recent exposure to sick contacts. He reported that his children were exposed to a COVID + individual ~ 2 weeks ago and he and his family quarantined. His children had mild "cold like" symptoms, but he denied any symptoms himself.    In ED, COVID test was  negative. BNP  WNL at 55. HS troponin neg x 2 (8>>7). CBC w/ mild leukocytosis, 11.2, mild anemia w/ Hgb at 11.4 and c/w baseline. BMP w/ SCr 0.82, K 3.7, Na 132. CXR w/ small left pleural effusion. Chest CT negative for PE but showed new moderate to large pericardial effusion.   He was symptomatic w/ severe pleuritic chest pain, tachycardia, and shortness of breath leading to urgent pericardiocentesis by Dr. Burt Knack. 400 cc of straw colored serous fluid  drained w/ symptomatic improvement. Cultures were negative. LD and protein normal. Inflammatory markers elevated. CRP 28. ESR 70. ANA negative.  Post procedural 2D echo on 2/8 showed interval improvement w/ small pericardial effusion. EF 20%. He was started on high dose ASA 650 mg tid + colchicine 0.6 mg bid. PPI therapy initiated for GI protection. His pluritic chest and scapular pain improved. No dyspnea  Subxiphoid drain removed on 2/9. Had repeat limited echo on 2/10 that showed small residual effusion around heart, no tamponade.   His HF meds were initially held on admit but later resumed. BP remained stable and he was transitioned from Losartan to [facility] and tolerated well.   Also of note, he had a repeat lipid panel that showed marked improvement w/ statin, Vascepa + fenofibrate. TG down to 102 and LDL 48.   On Chest CT, there was also incidental finding of a RLL pulmonary nodule, measuring 8 mm. He is a former smoker. Will need f/u scan in 3-6 months.   On 2/10, he was seen by Dr. Aundra Dubin and felt stable for discharge home. Post hospital f/u arranged w/  Dr. Aundra Dubin on 2/16. He will continue high dose ASA x 2 weeks and colchicine x 3 months.   Discharge Weight Range: 158 lb Discharge Vitals: Blood pressure 106/66, pulse 77, temperature 98 F (36.7 C), temperature source Oral, resp. rate 16, height _0  (1.778 m), weight 72.1 kg, SpO2 98 %.  Labs: Lab Results  Component Value Date   WBC 6.1 09/02/2019   HGB 11.5 (L) 09/02/2019   HCT 33.4 (L) 09/02/2019    MCV 93.8 09/02/2019   PLT 306 09/02/2019    Recent Labs  Lab 08/30/19 1725 08/30/19 1932 09/02/19 0416  NA  --    < > 136  K  --    < > 4.3  CL  --    < > 100  CO2  --    < > 27  BUN  --    < > 10  CREATININE  --    < > 0.72  CALCIUM  --    < > 8.8*  PROT 7.2  --   --   BILITOT 1.2  --   --   ALKPHOS 59  --   --   ALT 17  --   --   AST 21  --   --   GLUCOSE  --    < > 100*   < > = values in this interval not displayed.   Lab Results  Component Value Date   CHOL 97 09/01/2019   HDL 29 (L) 09/01/2019   LDLCALC 48 09/01/2019   TRIG 102 09/01/2019   BNP (last 3 results) Recent Labs    08/30/19 1725  BNP 55.7    ProBNP (last 3 results) No results for input(s): PROBNP in the last 8760 hours.   Diagnostic Studies/Procedures   ECHOCARDIOGRAM LIMITED  Result Date: 09/02/2019    ECHOCARDIOGRAM LIMITED REPORT   Patient Name:   Richard Valenzuela Date of Exam: 09/02/2019 Medical Rec #:  [id number]   Height:       70.0 in Accession #:    [id number]  Weight:       158.9 lb Date of Birth:  18-Nov-1980  BSA:          1.89 m Patient Age:    39 years    BP:           109/77 mmHg Patient Gender: M           HR:           70 bpm. Exam Location:  Inpatient Procedure: Limited Echo, Limited Color Doppler and Cardiac Doppler Indications:    Pericardial Effusion  History:        Patient has prior history of Echocardiogram examinations, most                 recent 08/31/2019. CHF, Previous Myocardial Infarction; Risk                 Factors:Dyslipidemia and Former Smoker.  Sonographer:    Mikki Santee RDCS (AE) Referring Phys: [place]  1. Left ventricular ejection fraction, by estimation, is 20 to 25%. The left ventricle has severely decreased function. The left ventrical demonstrates global hypokinesis. Left ventricular diastolic parameters are consistent with Grade II diastolic dysfunction (pseudonormalization).  2. Right ventricular systolic function is normal. The  right ventricular size is normal. Tricuspid regurgitation signal is inadequate for assessing PA pressure.  3. The mitral valve is normal in structure and function. no evidence of  mitral valve regurgitation. No evidence of mitral stenosis.  4. The aortic valve is normal in structure and function. Aortic valve regurgitation is not visualized. No aortic stenosis is present.  5. The inferior vena cava is dilated in size with >50% respiratory variability, suggesting right atrial pressure of 8 mmHg.  6. There is a small pericardial effusion localized near the right atrium and anterior to the right ventricle. FINDINGS  Left Ventricle: Left ventricular ejection fraction, by estimation, is 20 to 25%. The left ventricle has severely decreased function. The left ventricle demonstrates global hypokinesis. The left ventricular internal cavity size was normal in size. There is no left ventricular hypertrophy. Normal left ventricular filling pressure. Right Ventricle: The right ventricular size is normal. No increase in right ventricular wall thickness. Right ventricular systolic function is normal. Tricuspid regurgitation signal is inadequate for assessing PA pressure. Left Atrium: Left atrial size was normal in size. Right Atrium: Right atrial size was normal in size. Pericardium: A small pericardial effusion is present. The pericardial effusion is localized near the right atrium and anterior to the right ventricle. There is no evidence of cardiac tamponade. Mitral Valve: The mitral valve is normal in structure and function. Normal mobility of the mitral valve leaflets. Mild mitral annular calcification. No evidence of mitral valve stenosis. Tricuspid Valve: The tricuspid valve is normal in structure. Tricuspid valve regurgitation is not demonstrated. No evidence of tricuspid stenosis. Aortic Valve: The aortic valve is normal in structure and function. Aortic valve regurgitation is not visualized. No aortic stenosis is present.  Pulmonic Valve: The pulmonic valve was normal in structure. Pulmonic valve regurgitation is not visualized. No evidence of pulmonic stenosis. Aorta: The aortic root is normal in size and structure. Venous: The inferior vena cava is dilated in size with greater than 50% respiratory variability, suggesting right atrial pressure of 8 mmHg. The inferior vena cava and the hepatic vein show a normal flow pattern. IAS/Shunts: No atrial level shunt detected by color flow Doppler.   Diastology LV e' lateral:   8.59 cm/s LV E/e' lateral: 8.6 LV e' medial:    5.77 cm/s LV E/e' medial:  12.8  MITRAL VALVE MV Area (PHT): 2.09 cm MV Decel Time: 363 msec MV E velocity: 73.85 cm/s 103 cm/s MV A velocity: 71.10 cm/s 70.3 cm/s MV E/A ratio:  1.04       1.5 Fransico Him MD Electronically signed by Fransico Him MD Signature Date/Time: 09/02/2019/2:17:41 PM    Final     Discharge Medications   Allergies as of 09/02/2019   No Known Allergies     Medication List    STOP taking these medications   [facility] Aspirin Adult Low Dose 81 MG chewable tablet Generic drug: aspirin Replaced by: aspirin 325 MG tablet   losartan 25 MG tablet Commonly known as: COZAAR     TAKE these medications   acetaminophen 650 MG CR tablet Commonly known as: TYLENOL Take 650 mg by mouth every 8 (eight) hours as needed for pain.   aspirin 325 MG tablet Take 2 tablets (650 mg total) by mouth every 8 (eight) hours. Replaces: [facility] Aspirin Adult Low Dose 81 MG chewable tablet   carvedilol 3.125 MG tablet Commonly known as: COREG Take 1 tablet (3.125 mg total) by mouth 2 (two) times daily.   colchicine 0.6 MG tablet Take 1 tablet (0.6 mg total) by mouth 2 (two) times daily.   digoxin 0.125 MG tablet Commonly known as: LANOXIN Take 1 tablet (0.125 mg total) by  mouth daily.   fenofibrate 48 MG tablet Commonly known as: TRICOR Take 1 tablet (48 mg total) by mouth daily.   folic acid 383 MCG tablet Commonly known as: FOLVITE Take 400  mcg by mouth daily.   multivitamin with minerals Tabs tablet Take 1 tablet by mouth daily.   nicotine 21 mg/24hr patch Commonly known as: NICODERM CQ - dosed in mg/24 hours Place 1 patch (21 mg total) onto the skin daily. What changed:   when to take this  reasons to take this   pantoprazole 40 MG tablet Commonly known as: PROTONIX Take 1 tablet (40 mg total) by mouth 2 (two) times daily for 14 days. Return to 1 tablet daily after 14 days What changed:   when to take this  additional instructions   rosuvastatin 40 MG tablet Commonly known as: CRESTOR Take 1 tablet (40 mg total) by mouth daily at 6 PM.   sacubitril-valsartan 24-26 MG Commonly known as: ENTRESTO Take 1 tablet by mouth 2 (two) times daily.   spironolactone 25 MG tablet Commonly known as: ALDACTONE Take 1 tablet (25 mg total) by mouth at bedtime.   thiamine 250 MG tablet Take 125 mg by mouth daily.   Vascepa 1 g capsule Generic drug: icosapent Ethyl Take 2 g by mouth 2 (two) times daily.       Disposition   The patient will be discharged in stable condition to home.  Follow-up Information    [facility] Follow up on 09/08/2019.   Specialty: Cardiology Why: Dr. Aundra Dubin' Office, Advanced Heart Failure Clinic at 2:40 PM Parking Garage Code 6008 Contact information: [address] [id number] [place] [phone]            Duration of Discharge Encounter: Greater than 35 minutes   Signed, Nelida Gores 09/02/2019, 2:55 PM

## 2019-09-02 NOTE — Care Management (Signed)
09-02-19 Benefits check submitted for Colchicine and Entresto. Case Manager will provide patient with discount card for [facility]. No further needs from Case Manager at this time. Gala Lewandowsky, RN,BSN Case Manager [phone]

## 2019-09-02 NOTE — Progress Notes (Signed)
Patient ID: Richard Valenzuela, male   DOB: 1980-08-27, 38 y.o.   MRN: [id number]     Advanced Heart Failure Rounding Note  PCP-Cardiologist: Dr. Aundra Dubin   Subjective:    39 y.o. malewith history of ETOH abuse and smoking, nonobstructive CAD, and primarily nonischemic cardiomyopathy admitted w/ acute pericarditis with pericardial effusion/early tamponade, s/p pericardicentesis 2/7.    Post procedural 2D echo w/ small pericardial effusion. EF 20%.  Pericardial drain removed 2/9.   Echo today after drain removal showed small residual effusion, no evidence for tamponade.  EF 20% range.   Inflammatory markers elevated. CRP 28. ESR 70. ANA negative.   On combination therapy, high dose ASA 650 mg q8h + colchicine 0.6 mg bid.  Still with some pleuritic CP but it is better.   No dyspnea.   Lipid Panel     Component Value Date/Time   CHOL 97 09/01/2019 0331   TRIG 102 09/01/2019 0331   HDL 29 (L) 09/01/2019 0331   CHOLHDL 3.3 09/01/2019 0331   VLDL 20 09/01/2019 0331   LDLCALC 48 09/01/2019 0331   LDLDIRECT 259.8 (H) 07/20/2019 2208    Objective:   Weight Range: 72.1 kg Body mass index is 22.8 kg/m.   Vital Signs:   Temp:  [97.8 F (36.6 C)-97.9 F (36.6 C)] 97.9 F (36.6 C) (02/10 0550) Pulse Rate:  [66-85] 66 (02/10 0550) Resp:  [13-15] 15 (02/10 0550) BP: (100-120)/(70-78) 109/77 (02/10 0550) SpO2:  [98 %] 98 % (02/10 0550) Weight:  [72.1 kg-74.8 kg] 72.1 kg (02/10 0550) Last BM Date: 08/31/19  Weight change: Filed Weights   09/01/19 1334 09/02/19 0550  Weight: 74.8 kg 72.1 kg    Intake/Output:   Intake/Output Summary (Last 24 hours) at 09/02/2019 1238 Last data filed at 09/01/2019 2320 Gross per 24 hour  Intake 360 ml  Output --  Net 360 ml      Physical Exam    General: NAD Neck: No JVD, no thyromegaly or thyroid nodule.  Lungs: Clear to auscultation bilaterally with normal respiratory effort. CV: Nondisplaced PMI.  Heart regular S1/S2, no S3/S4, no friction  rub.  No peripheral edema.   Abdomen: Soft, nontender, no hepatosplenomegaly, no distention.  Skin: Intact without lesions or rashes.  Neurologic: Alert and oriented x 3.  Psych: Normal affect. Extremities: No clubbing or cyanosis.  HEENT: Normal.    Telemetry   NSR 70s, personally reviewed   EKG    No new EKG to review.   Labs    CBC Recent Labs    09/01/19 0331 09/02/19 0416  WBC 6.7 6.1  HGB 10.6* 11.5*  HCT 30.6* 33.4*  MCV 95.3 93.8  PLT 249 324   Basic Metabolic Panel Recent Labs    09/01/19 0331 09/02/19 0416  NA 135 136  K 4.2 4.3  CL 96* 100  CO2 28 27  GLUCOSE 105* 100*  BUN 10 10  CREATININE 0.71 0.72  CALCIUM 8.6* 8.8*   Liver Function Tests Recent Labs    08/30/19 1725  AST 21  ALT 17  ALKPHOS 59  BILITOT 1.2  PROT 7.2  ALBUMIN 3.3*   Recent Labs    08/30/19 1725  LIPASE 25   Cardiac Enzymes No results for input(s): CKTOTAL, CKMB, CKMBINDEX, TROPONINI in the last 72 hours.  BNP: BNP (last 3 results) Recent Labs    08/30/19 1725  BNP 55.7    ProBNP (last 3 results) No results for input(s): PROBNP in the last 8760 hours.  D-Dimer No results for input(s): DDIMER in the last 72 hours. Hemoglobin A1C No results for input(s): HGBA1C in the last 72 hours. Fasting Lipid Panel Recent Labs    09/01/19 0331  CHOL 97  HDL 29*  LDLCALC 48  TRIG 102  CHOLHDL 3.3   Thyroid Function Tests Recent Labs    08/31/19 0957  TSH 2.673    Other results:   Imaging    No results found.   Medications:     Scheduled Medications: . aspirin  650 mg Oral Q8H  . carvedilol  3.125 mg Oral BID WC  . colchicine  0.6 mg Oral BID  . digoxin  0.125 mg Oral Daily  . fenofibrate  54 mg Oral Daily  . folic acid  9,741 mcg Oral Daily  . heparin  5,000 Units Subcutaneous Q8H  . icosapent Ethyl  2 g Oral BID  . multivitamin with minerals  1 tablet Oral Daily  . pantoprazole  40 mg Oral BID  . rosuvastatin  40 mg Oral q1800  .  sacubitril-valsartan  1 tablet Oral BID  . spironolactone  25 mg Oral Daily  . thiamine  125 mg Oral Daily    Infusions:   PRN Medications: acetaminophen, morphine injection, traMADol    Assessment/Plan   1. Acute pericarditis with pericardial effusion/early tamponade: Now s/p pericardiocentesis, drain removed 2/9.  Fluid culture so far negative.  Afebrile with normal WBCs. Inflammatory markers elevated,  CRP 28. ESR 70. ANA negative. Cause uncertain, ?viral pericarditis.  There was some question of a pericarditis component at last admission, but colchicine was not continued at discharge due to issues with his pharmacy (and pericarditis diagnosis not thought to be likely). Echo with small residual effusion around heart, no tamponade.  Still with pericardial symptoms but improved.   - Will give ASA 650 mg every 8 hrs x 2 wks.  - colchicine 0.6 bid x 3 months.  - Follow CRP and ESR as outpatient to help guide ASA taper  2.Chronicsystolic CHF: Echo at [facility] in 12/20with EF 25-30%, normal RV. Cardiolite showed EF 15% with fixed defect. LHC/RHC 12/20showed nonobstructive coronary disease and low filling pressures, CI 2.2.  cRMI in 12/20w/o evidence of myocarditis or infiltrative disease, LVEF 10%.  Echo this admission with EF about 20% with mildly decreased RV systolic function.Cardiomyopathymaybe related to heavy ETOH use. NYHA class II symptoms now. Not volume overloaded on exam.  - continue Entresto 24/26 bid.   - Does not need diuretic.  - Continue digoxin 0.125 daily.  - Continue spironolactone 25 daily  - Continue Coreg 3.125 mg bid.   - Repeat echo after about 6 months for ?ICD. Narrow QRS, not CRT candidate. 2. 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 FH of CAD, markedly high lipids, smoker.  - Will need ASA 81 long-term. Using high dose aspirin now for pericardial symptoms.  - Continue Crestor 40 mg daily. 3. Hyperlipidemia: LDL 48 with TGs 102, much improved. -  Started on Crestor 40 mg daily 12/20 - Started Vascepa and fenofibrate=>will use lower dose of fenofibrate (45 mg daily) given small risk of interaction with Crestor.  4. Acute pancreatitis: ?Due to ETOH vs elevated triglycerides vs both, this was last admission. 5Smoking:He has quit. 6. ETOH abuse:He has quit, imperative to stay off ETOH as I suspect that it was a major contributor to his cardiomyopathy. 7. Disposition: Home today.  Followup with me in CHF clinic.  Meds for home: Entresto 24/26 bid, spironolactone 25 daily, Coreg 3.125 mg  bid, digoxin 0.125 daily, colchicine 0.6 bid x 3 months, aspirin 650 mg tid x 2 weeks total then back to 81 mg daily, Vascepa 2 g bid, Crestor 40 daily, fenofibrate 54 daily, Protonix 40 daily until he is finished with high dose aspirin.    Length of Stay: 3  Loralie Champagne, MD  09/02/2019, 12:38 PM  Advanced Heart Failure Team Pager [phone] (M-F; 7a - 4p)  Please contact [facility] for night-coverage after hours (4p -7a ) and weekends on [email or web address]

## 2019-09-02 NOTE — Progress Notes (Signed)
  Echocardiogram 2D Echocardiogram has been performed.  Richard Valenzuela 09/02/2019, 10:59 AM

## 2019-09-05 LAB — CULTURE, BODY FLUID W GRAM STAIN -BOTTLE
Culture: NO GROWTH
Special Requests: ADEQUATE

## 2019-09-07 ENCOUNTER — Encounter (HOSPITAL_COMMUNITY): Payer: Self-pay | Admitting: *Deleted

## 2019-09-07 ENCOUNTER — Telehealth (HOSPITAL_COMMUNITY): Payer: Self-pay | Admitting: *Deleted

## 2019-09-07 NOTE — Telephone Encounter (Signed)
Letter e-mailed to patient.

## 2019-09-07 NOTE — Telephone Encounter (Signed)
When does he want to go back to work? Ok with me to go later this week if chest pain has resolved.

## 2019-09-07 NOTE — Progress Notes (Signed)
Letter  

## 2019-09-07 NOTE — Telephone Encounter (Signed)
Pt left VM requesting a return to work note.   Routed to Dr.McLean for clearance/ restrictions

## 2019-09-08 ENCOUNTER — Encounter (HOSPITAL_COMMUNITY): Payer: Self-pay | Admitting: Cardiology

## 2019-09-08 ENCOUNTER — Ambulatory Visit (HOSPITAL_COMMUNITY)
Admission: RE | Admit: 2019-09-08 | Discharge: 2019-09-08 | Disposition: A | Discharge status: Home / Self Care | Payer: Commercial Managed Care - PPO | Source: Ambulatory Visit | Attending: Cardiology | Admitting: Cardiology

## 2019-09-08 ENCOUNTER — Other Ambulatory Visit: Payer: Self-pay

## 2019-09-08 VITALS — BP 110/76 | HR 87 | Wt 161.0 lb

## 2019-09-08 DIAGNOSIS — I519 Heart disease, unspecified: Secondary | ICD-10-CM | POA: Diagnosis not present

## 2019-09-08 DIAGNOSIS — I5022 Chronic systolic (congestive) heart failure: Secondary | ICD-10-CM | POA: Insufficient documentation

## 2019-09-08 DIAGNOSIS — Z87891 Personal history of nicotine dependence: Secondary | ICD-10-CM | POA: Insufficient documentation

## 2019-09-08 DIAGNOSIS — Z8249 Family history of ischemic heart disease and other diseases of the circulatory system: Secondary | ICD-10-CM | POA: Insufficient documentation

## 2019-09-08 DIAGNOSIS — I309 Acute pericarditis, unspecified: Secondary | ICD-10-CM

## 2019-09-08 DIAGNOSIS — Z7982 Long term (current) use of aspirin: Secondary | ICD-10-CM | POA: Insufficient documentation

## 2019-09-08 DIAGNOSIS — Z79899 Other long term (current) drug therapy: Secondary | ICD-10-CM | POA: Insufficient documentation

## 2019-09-08 DIAGNOSIS — E785 Hyperlipidemia, unspecified: Secondary | ICD-10-CM | POA: Diagnosis not present

## 2019-09-08 DIAGNOSIS — I2510 Atherosclerotic heart disease of native coronary artery without angina pectoris: Secondary | ICD-10-CM | POA: Diagnosis not present

## 2019-09-08 LAB — BASIC METABOLIC PANEL
Anion gap: 12 (ref 5–15)
BUN: 15 mg/dL (ref 6–20)
CO2: 22 mmol/L (ref 22–32)
Calcium: 9.1 mg/dL (ref 8.9–10.3)
Chloride: 104 mmol/L (ref 98–111)
Creatinine, Ser: 0.87 mg/dL (ref 0.61–1.24)
GFR calc Af Amer: >60 mL/min (ref >60)
GFR calc non Af Amer: >60 mL/min (ref >60)
Glucose, Bld: 110 mg/dL — ABNORMAL HIGH (ref 70–99)
Potassium: 4.5 mmol/L (ref 3.5–5.1)
Sodium: 138 mmol/L (ref 135–145)

## 2019-09-08 LAB — C-REACTIVE PROTEIN: CRP: 22.9 mg/dL — ABNORMAL HIGH (ref <1.0)

## 2019-09-08 LAB — SEDIMENTATION RATE: Sed Rate: 96 mm/hr — ABNORMAL HIGH (ref 0–16)

## 2019-09-08 LAB — DIGOXIN LEVEL: Digoxin Level: 0.3 ng/mL — ABNORMAL LOW (ref 0.8–2.0)

## 2019-09-08 MED ORDER — CARVEDILOL 6.25 MG PO TABS: 6.2500 mg | ORAL_TABLET | Freq: Two times a day (BID) | ORAL | 4 refills | Status: DC

## 2019-09-08 NOTE — Patient Instructions (Signed)
COMPLETE two week course of Aspirin   INCREASE Coreg (carvedilol) to 6.25mg  (1 tab) twice a day   Labs today  We will only contact you if something comes back abnormal or we need to make some changes. Otherwise no news is good news!   Your physician has requested that you have an echocardiogram. Echocardiography is a painless test that uses sound waves to create images of your heart. It provides your doctor with information about the size and shape of your heart and how well your heart's chambers and valves are working. This procedure takes approximately one hour. There are no restrictions for this procedure.   Your physician recommends that you schedule a follow-up appointment in: 3 weeks with pharmacy and an ECHO. 6 weeks with Dr Shirlee Latch   Please call office at [phone] option [phone] if you have any questions or concerns.    At the Advanced [facility] Clinic, you and your health needs are our priority. As part of our continuing mission to provide you with exceptional heart care, we have created designated Provider Care Teams. These Care Teams include your primary Cardiologist (physician) and Advanced Practice Providers (APPs- Physician Assistants and Nurse Practitioners) who all work together to provide you with the care you need, when you need it.   You may see any of the following providers on your designated Care Team at your next follow up: Marland Kitchen Dr Arvilla Meres . Dr Marca Ancona . Tonye Becket, NP . Robbie Lis, PA . Karle Plumber, PharmD   Please be sure to bring in all your medications bottles to every appointment.

## 2019-09-09 ENCOUNTER — Other Ambulatory Visit (HOSPITAL_COMMUNITY): Payer: Self-pay

## 2019-09-09 ENCOUNTER — Telehealth (HOSPITAL_COMMUNITY): Payer: Self-pay | Admitting: Licensed Clinical Social Worker

## 2019-09-09 ENCOUNTER — Telehealth (HOSPITAL_COMMUNITY): Payer: Self-pay

## 2019-09-09 MED ORDER — ASPIRIN 325 MG PO TABS: 650.0000 mg | ORAL_TABLET | Freq: Three times a day (TID) | ORAL | 0 refills | Status: DC

## 2019-09-09 MED ORDER — ICOSAPENT ETHYL 1 G PO CAPS: 2.0000 g | ORAL_CAPSULE | Freq: Two times a day (BID) | ORAL | 11 refills | Status: DC

## 2019-09-09 MED ORDER — OMEGA-3-ACID ETHYL ESTERS 1 G PO CAPS: 2.0000 g | ORAL_CAPSULE | Freq: Two times a day (BID) | ORAL | 3 refills | Status: DC

## 2019-09-09 NOTE — Telephone Encounter (Signed)
CSW exploring other options for pt Vacepa- unable to find pharmacy that carries the generic version which is about $100 and pt stating the $200 for the vascepa is too much (also would be more than that after the 90 day coupon expires after 2 more fills).  Had MD send in Lovaza which previous note from lipid clinic states is preferred by pt insurance but they are stating it isn't covered by insurance- CSW informed pharmacist who will attempt to complete non formulary request for medication so insurance will cover it.  There is also GoodRx card that can bring Lovaza cost down to $30 at [employer] if we are not able to work out through [employer]  CSW will check in with patient next week to discuss further- pt reports having plenty of medication at this time so no imminent concerns of running out.  Burna Sis, LCSW Clinical Social Worker Advanced [facility] Failure Clinic Desk#: [phone] Cell#: [phone]

## 2019-09-09 NOTE — Progress Notes (Signed)
Cardiology: Dr. Aundra Dubin  39 y.o. with history of ETOH abuse and smoking, nonobstructive CAD, and primarily nonischemic cardiomyopathy presents for followup of CHF.  Patient has a strong family history of CAD, both his mother and father had CAD diagnosed in their 33s.  He moved to [place] about a year ago from [place] for his job.  Initially, he was here without his family and started drinking quite heavily. He also has smoked chronically.   He was admitted in 12/20 with pleuritic chest pain and abdominal pain.  Amylase and lipase were high suggesting acute pancreatitis, due to ETOH but also with possible contribution from very high triglycerides (4000 range initially).  Echo was done, showing EF 25%. Patient was initially admitted to [facility], but at this point was transferred to [facility].  CMRI showed EF 10% with no LGE.  Cath showed low filling pressures and age-advanced CAD but no tight blockages. No arrhythmias were noted while in the hospital.  He was started slowly on cardiac meds (due to soft BP) and discharged home.   He was re-admitted in 2/21 with sudden onset pleuritic chest pain concerning for acute pericarditis.  Echo showed pericardial tamponade and he had pericardiocentesis.  Echo after pericardial drain was removed showed EF 20-25%, global hypokinesis, normal RV.  He was started on high dose ASA and colchicine.    He continues to have mild pleuritic chest pain, significantly better than when in the hospital.  No significant exertional dyspnea.  No lightheadedness, tolerating Entresto well.  No orthopnea/PND.  Wants to go back to work.    Labs (12/20): K 4.1, creatinine 0.9, LDL 260, TGs 625, HDL 14 Labs (2/21): ANA negative, K 4.3, creatinine 0.72, LDL 48, HDL 29, TGs 112, ESR 70, CRP 28.   PMH: 1. ETOH abuse 2. Smoking 3. H/o ETOH pancreatitis in 12/20. 4. 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  5. Chronic systolic CHF: Primarily nonischemic cardiomyopathy.  Possibly related to ETOH abuse.  - Echo  (12/20): Severely dilated LV with EF 25-30%.  - Cardiac MRI (12/20): EF 10%, moderate LV dilation, mild-moderately decreased RV systolic function, no late gadolinium enhancement.  - RHC/LHC (12/20): mean RA 1, PA 14/2, mean PCWP 2, CI 2.2.  Distal LAD narrows without severe discrete stenosis, 40% ostial D1, diffuse luminal irregularities.   - Echo (2/20): EF 20-25%, global HK, RV normal.  6. CAD: Coronary angiography in 12/20 showed that distal LAD narrows without severe discrete stenosis, 40% ostial D1, diffuse luminal irregularities.  7. Acute pericarditis with pericardial tamponade: 2/21, had pericardiocentesis.   SH: Married, works for a [employer], originally from [place], history of ETOH abuse and smoking.   FH: Father with CAD at age 51, mother with stents in her 29s.   ROS: All systems reviewed and negative except as per HPI.   Current Outpatient Medications  Medication Sig Dispense Refill  . acetaminophen (TYLENOL) 650 MG CR tablet Take 650 mg by mouth every 8 (eight) hours as needed for pain.    . carvedilol (COREG) 6.25 MG tablet Take 1 tablet (6.25 mg total) by mouth 2 (two) times daily. 60 tablet 4  . colchicine 0.6 MG tablet Take 1 tablet (0.6 mg total) by mouth 2 (two) times daily. 60 tablet 3  . digoxin (LANOXIN) 0.125 MG tablet Take 1 tablet (0.125 mg total) by mouth daily. 30 tablet 2  . fenofibrate (TRICOR) 48 MG tablet Take 1 tablet (48 mg total) by mouth daily. 30 tablet 6  . folic acid (FOLVITE) 062 MCG  tablet Take 400 mcg by mouth daily.    . Multiple Vitamin (MULTIVITAMIN WITH MINERALS) TABS tablet Take 1 tablet by mouth daily.    . nicotine (NICODERM CQ - DOSED IN MG/24 HOURS) 21 mg/24hr patch Place 21 mg onto the skin daily as needed.    . pantoprazole (PROTONIX) 40 MG tablet Take 1 tablet (40 mg total) by mouth 2 (two) times daily for 14 days. Return to 1 tablet daily after 14 days 28 tablet 0  . rosuvastatin (CRESTOR) 40 MG tablet Take 1 tablet (40 mg total) by  mouth daily at 6 PM. 30 tablet 2  . sacubitril-valsartan (ENTRESTO) 24-26 MG Take 1 tablet by mouth 2 (two) times daily. 60 tablet 3  . spironolactone (ALDACTONE) 25 MG tablet Take 1 tablet (25 mg total) by mouth at bedtime. 30 tablet 6  . thiamine 250 MG tablet Take 125 mg by mouth daily.    Marland Kitchen aspirin 325 MG tablet Take 2 tablets (650 mg total) by mouth every 8 (eight) hours. For 1 week 42 tablet 0  . icosapent Ethyl (VASCEPA) 1 g capsule Take 2 capsules (2 g total) by mouth 2 (two) times daily. 120 capsule 11   No current facility-administered medications for this encounter.   BP 110/76   Pulse 87   Wt 73 kg (161 lb)   SpO2 97%   BMI 23.10 kg/m  General: NAD Neck: No JVD, no thyromegaly or thyroid nodule.  Lungs: Clear to auscultation bilaterally with normal respiratory effort. CV: Nondisplaced PMI.  Heart regular S1/S2, no S3/S4, no murmur, no rub.  No peripheral edema.  No carotid bruit.  Normal pedal pulses.  Abdomen: Soft, nontender, no hepatosplenomegaly, no distention.  Skin: Intact without lesions or rashes.  Neurologic: Alert and oriented x 3.  Psych: Normal affect. Extremities: No clubbing or cyanosis.  HEENT: Normal.   Assessment/Plan: 1. Chronic systolic CHF: Echo at [facility] in 12/20 with EF 25-30%, normal RV. Cardiolite showed EF 15% with fixed defect. LHC/RHC 12/20 showed nonobstructive coronary disease and low filling pressures, CI 2.2. Had pleuritic chest pain possibly consistent with perimyocarditis however hs troponin not elevated and cRMI in 12/20 w/o evidence of myocarditis or infiltrative disease. Cardiomyopathy may also be related to heavy ETOH use.  Episode of acute pericarditis in 2/21 concerns me that the initial lesion may have been perimyocarditis.  NYHA class II symptoms now.  Not volume overloaded on exam.  - Continue Entresto 24/26 bid.  BMET today.  - He does not need a diuretic.  - Continue digoxin 0.125 daily, check level today.  - Continue  spironolactone 25 daily.   - Increase Coreg to 6.25 mg bid.   - Repeat echo after about 6 months for ?ICD (6/21). Narrow QRS, not CRT candidate.  2. 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 FH of CAD, markedly high lipids, smoker.  - Will need ASA 81 long-term.  - Started on Crestor 40 mg daily. Good lipids in 2/21.   3. Hyperlipidemia: LDL 260, HDL 14, TGs 625 (but 4792 when checked at [facility]). 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  - Started on Crestor 40 mg daily, LDL much better in 2/21.  - Started Vascepa and fenofibrate => will use lower dose of fenofibrate (45 mg daily) given small risk of interaction with Crestor.  I do not think he is going to be able to afford Vascepa, may have to change to Lovaza. TGs much improved on lipids in 2/21.  - He is being followed in lipid clinic.  4. Acute pancreatitis: ?Due to ETOH vs elevated triglycerides vs both. Resolved.   5 Smoking: He has quit.  6. ETOH abuse:  He has quit, imperative to stay off ETOH as I suspect that it was a major contributor to his cardiomyopathy.  7. Acute pericarditis: May have had pericarditis in 12/20, now with acute pericarditis in 2/21.  Still with mild pleuritic chest pain.  - Continue high dose ASA to complete 2 wks, then decrease to 81 mg daily.  - Continue colchicine 0.6 bid x 3 months.  - Repeat echo in 3 wks for pericardial effusion.  - Check ESR and CRP to see if these are coming down.   Followup in 3 wks with pharmacist for med titration, 6 wks with me.    Loralie Champagne 09/09/2019

## 2019-09-09 NOTE — Progress Notes (Signed)
CSW consulted to meet with pt during appt due to possible financial concerns.  Pt reports having trouble paying for his Vascepa which is over $200 at this time with his insurance as it is not a preferred medication- reports the generic version is about $100 but is not available anywhere.  Pt has already been given a Vascepa savings card which is why it is only $200 so no other manufacturer options at this time. CSW will try to find alternative manner to help pt get medication for less.  Pt also reported that he had a large amount of medical bills from his recent hospital stays (over $30,000) which he is unable to afford at this time.  CSW encouraged pt to call [employer] to discuss Medicaid programs as patient might qualify for a deductible medicaid program which could help back pay for some of his hospital expenses.  CSW will continue to look into alternative options to assist with medication costs and follow up with patient  Burna Sis, LCSW Clinical Social Worker [facility] Desk#: [phone] Cell#: [phone]

## 2019-09-09 NOTE — Telephone Encounter (Signed)
Pt aware of results of blood work and recommendations to continue high dose aspirin for total of 3 weeks as inflammatory markers remain elevated. 1 additional week called into pharmacy as patient was originally on 2 weeks.  Verbalized understanding.

## 2019-09-09 NOTE — Telephone Encounter (Signed)
-----   Message from Laurey Morale, MD sent at 09/09/2019  8:08 AM EST ----- Inflammatory markers are still high.  Continue high dose aspirin for a total of 3 wks.

## 2019-09-11 ENCOUNTER — Telehealth (HOSPITAL_COMMUNITY): Payer: Self-pay | Admitting: *Deleted

## 2019-09-11 NOTE — Telephone Encounter (Signed)
Received VM from Fleet Contras stating pt had opened a claim for STD and she needed to confirm the dates and dx for this.  I called and spoke w/pt, he states his job made him submit for STD to cover the time he was out for being hospitalized, he states only for dates 2/8 through 2/11 and gave permission for me to release his information to [employer].  Attempted to call Fleet Contras back and got her ID VM, left mess confirming STD for 2/8 through 2/11 for chronic systolic heart failure, call back for further questions.

## 2019-09-18 ENCOUNTER — Telehealth (HOSPITAL_COMMUNITY): Payer: Self-pay | Admitting: Licensed Clinical Social Worker

## 2019-09-18 MED ORDER — OMEGA-3-ACID ETHYL ESTERS 1 G PO CAPS: 2.0000 g | ORAL_CAPSULE | Freq: Two times a day (BID) | ORAL | 3 refills | Status: DC

## 2019-09-18 NOTE — Telephone Encounter (Signed)
CSW called pt back to inform that least expensive fish oil option for him is going to be to fill lovaza at a [facility] pharmacy with a Good-Rx card (will be about $25).  Pt is agreeable and CSW requested clinic staff send to closest [facility] option for patient.  CSW will continue to follow and assist as needed  Burna Sis, LCSW Clinical Social Worker [facility] Desk#: [phone] Cell#: [phone]

## 2019-09-18 NOTE — Addendum Note (Signed)
Addended by: Theresia Bough on: 09/18/2019 02:05 PM   Modules accepted: Orders

## 2019-09-24 ENCOUNTER — Other Ambulatory Visit: Payer: Self-pay | Admitting: Cardiology

## 2019-10-05 ENCOUNTER — Ambulatory Visit (HOSPITAL_COMMUNITY)
Admission: RE | Admit: 2019-10-05 | Discharge: 2019-10-05 | Disposition: A | Discharge status: Home / Self Care | Payer: Commercial Managed Care - PPO | Source: Ambulatory Visit | Attending: Internal Medicine | Admitting: Internal Medicine

## 2019-10-05 ENCOUNTER — Other Ambulatory Visit: Payer: Self-pay

## 2019-10-05 ENCOUNTER — Ambulatory Visit (HOSPITAL_BASED_OUTPATIENT_CLINIC_OR_DEPARTMENT_OTHER)
Admission: RE | Admit: 2019-10-05 | Discharge: 2019-10-05 | Disposition: A | Discharge status: Home / Self Care | Payer: Commercial Managed Care - PPO | Source: Ambulatory Visit | Attending: Internal Medicine | Admitting: Internal Medicine

## 2019-10-05 ENCOUNTER — Encounter (HOSPITAL_COMMUNITY): Payer: Self-pay

## 2019-10-05 VITALS — BP 142/82 | HR 77 | Wt 167.2 lb

## 2019-10-05 DIAGNOSIS — Z79899 Other long term (current) drug therapy: Secondary | ICD-10-CM | POA: Diagnosis not present

## 2019-10-05 DIAGNOSIS — I309 Acute pericarditis, unspecified: Secondary | ICD-10-CM | POA: Insufficient documentation

## 2019-10-05 DIAGNOSIS — Z8249 Family history of ischemic heart disease and other diseases of the circulatory system: Secondary | ICD-10-CM | POA: Diagnosis not present

## 2019-10-05 DIAGNOSIS — I5022 Chronic systolic (congestive) heart failure: Secondary | ICD-10-CM | POA: Diagnosis present

## 2019-10-05 DIAGNOSIS — E785 Hyperlipidemia, unspecified: Secondary | ICD-10-CM | POA: Diagnosis not present

## 2019-10-05 DIAGNOSIS — I2510 Atherosclerotic heart disease of native coronary artery without angina pectoris: Secondary | ICD-10-CM | POA: Diagnosis not present

## 2019-10-05 DIAGNOSIS — K859 Acute pancreatitis without necrosis or infection, unspecified: Secondary | ICD-10-CM | POA: Diagnosis not present

## 2019-10-05 DIAGNOSIS — Z87891 Personal history of nicotine dependence: Secondary | ICD-10-CM | POA: Insufficient documentation

## 2019-10-05 DIAGNOSIS — I519 Heart disease, unspecified: Secondary | ICD-10-CM | POA: Diagnosis not present

## 2019-10-05 DIAGNOSIS — I426 Alcoholic cardiomyopathy: Secondary | ICD-10-CM

## 2019-10-05 DIAGNOSIS — I428 Other cardiomyopathies: Secondary | ICD-10-CM | POA: Insufficient documentation

## 2019-10-05 LAB — ECHOCARDIOGRAM COMPLETE: Weight: 2675.2 oz

## 2019-10-05 MED ORDER — ENTRESTO 49-51 MG PO TABS: 1.0000 | ORAL_TABLET | Freq: Two times a day (BID) | ORAL | 3 refills | Status: DC

## 2019-10-05 NOTE — Patient Instructions (Addendum)
It was a pleasure seeing you today!  MEDICATIONS: -We are changing your medications today -Increase your Entresto to 1 tablet (49/51 mg) twice daily  -Call if you have questions about your medications.   NEXT APPOINTMENT: Return to clinic in 3 weeks with Dr. Shirlee Latch.  In general, to take care of your heart failure: -Limit your fluid intake to 2 Liters (half-gallon) per day.   -Limit your salt intake to ideally 2-3 grams (2000-3000 mg) per day. -Weigh yourself daily and record, and bring that "weight diary" to your next appointment.  (Weight gain of 2-3 pounds in 1 day typically means fluid weight.) -The medications for your heart are to help your heart and help you live longer.   -Please contact us before stopping any of your heart medications.  Call the clinic at [phone] with questions or to reschedule future appointments.

## 2019-10-05 NOTE — Progress Notes (Signed)
  Echocardiogram 2D Echocardiogram has been performed.  Tilak Oakley A Juergen Hardenbrook 10/05/2019, 3:22 PM

## 2019-10-05 NOTE — Progress Notes (Signed)
Cardiology: Dr. Shirlee Latch  HPI:  39 y.o. with history of ETOH abuse and smoking, nonobstructive CAD, and primarily nonischemic cardiomyopathy presents for follow up of CHF.  Patient has a strong family history of CAD, both his mother and father had CAD diagnosed in their 66s.  He moved to [place] about a year ago from [place] for his job.  Initially, he was here without his family and started drinking quite heavily. He also has smoked chronically.   He was admitted in 12/20 with pleuritic chest pain and abdominal pain.  Amylase and lipase were high suggesting acute pancreatitis, due to ETOH but also with possible contribution from very high triglycerides (4000 range initially).  Echo was done, showing EF 25%. Patient was initially admitted to [facility], but at this point was transferred to [facility].  CMRI showed EF 10% with no LGE.  Cath showed low filling pressures and age-advanced CAD but no tight blockages. No arrhythmias were noted while in the hospital.  He was started slowly on cardiac meds (due to soft BP) and discharged home.   He was re-admitted in 2/21 with sudden onset pleuritic chest pain concerning for acute pericarditis.  Echo showed pericardial tamponade and he had pericardiocentesis.  Echo after pericardial drain was removed showed EF 20-25%, global hypokinesis, normal RV.  He was started on high dose ASA and colchicine.    He was last seen by Dr. Shirlee Latch on 09/08/19 and his carvedilol was increased to 6.25 mg BID. Overall he is feeling a lot better. He denies dizziness, lightheadedness, palpitations, or chest pain. He says he has been fatigued over the last few days but this is due to long hours with work. He says his breathing is good and denies shortness of breath. He is able to complete all ADLs and remains physically active at home with doing home-improvement projects. His reported weights at home range from 160-165 lbs. He denies PND/orthopnea. His appetite is good. His BP is elevated on exam today,  although he states he did drink an energy drink this morning. He does not routinely check his blood pressure at home.  Marland Kitchen Shortness of breath/dyspnea on exertion? no  . Orthopnea/PND? no . Edema? no . Lightheadedness/dizziness? no . Daily weights at home? yes . Blood pressure/heart rate monitoring at home? no . Following low-sodium/fluid-restricted diet? yes  HF Medications: Entresto 24/26 mg BID Carvedilol 6.25 mg BID Spironolactone 25 mg daily Digoxin 0.125 mg daily  Has the patient been experiencing any side effects to the medications prescribed?  no  Does the patient have any problems obtaining medications due to transportation or finances?   No - has Nurse, learning disability  Understanding of regimen: good Understanding of indications: good Potential of compliance: good Patient understands to avoid NSAIDs. Patient understands to avoid decongestants.    Pertinent Lab Values (09/08/19): Marland Kitchen Serum creatinine 0.87, BUN 15, Potassium 4.5, Sodium 138, Digoxin 0.3 ng/mL  Vital Signs: . Weight: 167.2 lbs (dry weight: 160-165 lbs) . Blood pressure: 142/82  . Heart rate: 77   Assessment: 1. Chronic systolic CHF (EF 57-01%). LHC/RHC 12/20 showed nonobstructive coronary disease and low filling pressures, CI 2.2. Had pleuritic chest pain possibly consistent with perimyocarditishoweverhs troponin not elevatedand cRMI in 12/20 w/o evidence of myocarditis or infiltrative disease.Cardiomyopathy may also be related to heavy ETOH use.  Episode of acute pericarditis in 2/21 concerns me that the initial lesion may have been perimyocarditis.   -NYHA class II symptoms now.  Not volume overloaded on exam. BP elevated today at  142/82. - Continue carvedilol 6.25 mg BID - Increase Entresto to 49/51 mg BID. Repeat BMET in 3 weeks. - Continue spironolactone 25 mg daily - Continue digoxin 0.125 mg daily - Repeat ECHO today  - Basic disease state pathophysiology, medication indication, mechanism and  side effects reviewed at length with patient and he verbalized understanding  2. 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 FH of CAD, markedly high lipids, smoker.  - Will need ASA 81 long-term.  - Continue Crestor 40 mg daily.Good lipids in 2/21.    3. Hyperlipidemia: LDL 260, HDL 14, TGs 625 (but 4792 when checked at [facility]). 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  - Started on Crestor 40 mg daily, LDL much better in 2/21.  - Started Lovaza and fenofibrate => lower dose of fenofibrate (45 mg daily) given small risk of interaction with Crestor. Was not able to afford Vascepa. TGs much improved on lipids in 2/21.  - He is being followed in lipid clinic.    4. Acute pancreatitis: ?Due to ETOH vs elevated triglycerides vs both. Resolved.   5Smoking: He has quit.   6. ETOH abuse:  He has quit, imperative to stay off ETOH as it is suspected that it was a major contributor to his cardiomyopathy.   7. Acute pericarditis: May have had pericarditis in 12/20, now with acute pericarditis in 2/21.  Still with mild pleuritic chest pain.  - Continued ASA (completed 3 weeks of high-dose aspirin) - Continue colchicine 0.6 bid x 3 months.  - Repeat echo today to assess for pericardial effusion.    Plan: 1) Medication changes: Based on clinical presentation, vital signs and recent labs will increase Entresto to 49/51 mg BID. 2) Labs: 3 weeks with MD 3) Follow-up: 10/26/19 with Dr. Johny Shock, PharmD, BCPS Clinical Pharmacist Phone: [phone]  Audry Riles, PharmD, BCPS, BCCP, CPP Heart Failure Clinic Pharmacist [phone]

## 2019-10-26 ENCOUNTER — Encounter (HOSPITAL_COMMUNITY): Payer: Self-pay | Admitting: Cardiology

## 2019-10-26 ENCOUNTER — Other Ambulatory Visit: Payer: Self-pay

## 2019-10-26 ENCOUNTER — Ambulatory Visit (HOSPITAL_COMMUNITY)
Admission: RE | Admit: 2019-10-26 | Discharge: 2019-10-26 | Disposition: A | Discharge status: Home / Self Care | Payer: Commercial Managed Care - PPO | Source: Ambulatory Visit | Attending: Cardiology | Admitting: Cardiology

## 2019-10-26 VITALS — BP 112/82 | HR 87 | Wt 170.4 lb

## 2019-10-26 DIAGNOSIS — Z8249 Family history of ischemic heart disease and other diseases of the circulatory system: Secondary | ICD-10-CM | POA: Diagnosis not present

## 2019-10-26 DIAGNOSIS — I309 Acute pericarditis, unspecified: Secondary | ICD-10-CM

## 2019-10-26 DIAGNOSIS — Z7982 Long term (current) use of aspirin: Secondary | ICD-10-CM | POA: Insufficient documentation

## 2019-10-26 DIAGNOSIS — I2510 Atherosclerotic heart disease of native coronary artery without angina pectoris: Secondary | ICD-10-CM | POA: Diagnosis not present

## 2019-10-26 DIAGNOSIS — I5022 Chronic systolic (congestive) heart failure: Secondary | ICD-10-CM | POA: Insufficient documentation

## 2019-10-26 DIAGNOSIS — E785 Hyperlipidemia, unspecified: Secondary | ICD-10-CM | POA: Insufficient documentation

## 2019-10-26 DIAGNOSIS — Z79899 Other long term (current) drug therapy: Secondary | ICD-10-CM | POA: Diagnosis not present

## 2019-10-26 DIAGNOSIS — I519 Heart disease, unspecified: Secondary | ICD-10-CM

## 2019-10-26 DIAGNOSIS — Z87891 Personal history of nicotine dependence: Secondary | ICD-10-CM | POA: Diagnosis not present

## 2019-10-26 LAB — BASIC METABOLIC PANEL
Anion gap: 13 (ref 5–15)
BUN: 14 mg/dL (ref 6–20)
CO2: 22 mmol/L (ref 22–32)
Calcium: 8.9 mg/dL (ref 8.9–10.3)
Chloride: 101 mmol/L (ref 98–111)
Creatinine, Ser: 0.94 mg/dL (ref 0.61–1.24)
GFR calc Af Amer: >60 mL/min (ref >60)
GFR calc non Af Amer: >60 mL/min (ref >60)
Glucose, Bld: 98 mg/dL (ref 70–99)
Potassium: 4 mmol/L (ref 3.5–5.1)
Sodium: 136 mmol/L (ref 135–145)

## 2019-10-26 LAB — DIGOXIN LEVEL: Digoxin Level: 0.2 ng/mL — ABNORMAL LOW (ref 0.8–2.0)

## 2019-10-26 LAB — C-REACTIVE PROTEIN: CRP: 1.5 mg/dL — ABNORMAL HIGH (ref <1.0)

## 2019-10-26 MED ORDER — SACUBITRIL-VALSARTAN 97-103 MG PO TABS: 1.0000 | ORAL_TABLET | Freq: Two times a day (BID) | ORAL | 5 refills | Status: DC

## 2019-10-26 NOTE — Patient Instructions (Signed)
STOP Colchicine on 11/21/19    INCREASE Entresto to 97/103mg  (1 tab) twice a day   Labs today We will only contact you if something comes back abnormal or we need to make some changes. Otherwise no news is good news!   Your physician has requested that you have an echocardiogram. Echocardiography is a painless test that uses sound waves to create images of your heart. It provides your doctor with information about the size and shape of your heart and how well your heart's chambers and valves are working. This procedure takes approximately one hour. There are no restrictions for this procedure.  Your physician recommends that you schedule a follow-up appointment in: 2 weeks with the Pharmacist  Your physician recommends that you schedule a follow-up appointment in: June/2021 for an echo and appointment with Dr Shirlee Latch  Please call office at [phone] option 2 if you have any questions or concerns.   At the [facility], you and your health needs are our priority. As part of our continuing mission to provide you with exceptional heart care, we have created designated Provider Care Teams. These Care Teams include your primary Cardiologist (physician) and Advanced Practice Providers (APPs- Physician Assistants and Nurse Practitioners) who all work together to provide you with the care you need, when you need it.   You may see any of the following providers on your designated Care Team at your next follow up: Marland Kitchen Dr Arvilla Meres . Dr Marca Ancona . Tonye Becket, NP . Robbie Lis, PA . Karle Plumber, PharmD   Please be sure to bring in all your medications bottles to every appointment.

## 2019-10-27 NOTE — Progress Notes (Signed)
Cardiology: Dr. Aundra Dubin PCP: Patient, No Pcp Per  39 y.o. with history of ETOH abuse and smoking, nonobstructive CAD, and primarily nonischemic cardiomyopathy presents for followup of CHF.  Patient has a strong family history of CAD, both his mother and father had CAD diagnosed in their 3s.  He moved to [place] about a year ago from [place] for his job.  Initially, he was here without his family and started drinking quite heavily. He also has smoked chronically.   He was admitted in 12/20 with pleuritic chest pain and abdominal pain.  Amylase and lipase were high suggesting acute pancreatitis, due to ETOH but also with possible contribution from very high triglycerides (4000 range initially).  Echo was done, showing EF 25%. Patient was initially admitted to [facility], but at this point was transferred to [facility].  CMRI showed EF 10% with no LGE.  Cath showed low filling pressures and age-advanced CAD but no tight blockages. No arrhythmias were noted while in the hospital.  He was started slowly on cardiac meds (due to soft BP) and discharged home.   He was re-admitted in 2/21 with sudden onset pleuritic chest pain concerning for acute pericarditis.  Echo showed pericardial tamponade and he had pericardiocentesis.  Echo after pericardial drain was removed showed EF 20-25%, global hypokinesis, normal RV.  He was started on high dose ASA and colchicine.    Repeat echo in 3/21 showed EF up to 30-35%, normal RV, no pericardial effusion.   He is doing better.  He is back at work full time.  No dyspnea walking on flat ground, mild dyspnea with stairs.  No orthopnea/PND.  No further chest pain.  He is not drinking ETOH now or smoking.  Weight is up but eating better.   Labs (12/20): K 4.1, creatinine 0.9, LDL 260, TGs 625, HDL 14 Labs (2/21): ANA negative, K 4.3, creatinine 0.72 => 0.87, LDL 48, HDL 29, TGs 112, ESR 70, CRP 28 => 23, digoxin 0.3  PMH: 1. ETOH abuse 2. Smoking 3. H/o ETOH pancreatitis in 12/20. 4.  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  5. Chronic systolic CHF: Primarily nonischemic cardiomyopathy.  Possibly related to ETOH abuse.  - Echo (12/20): Severely dilated LV with EF 25-30%.  - Cardiac MRI (12/20): EF 10%, moderate LV dilation, mild-moderately decreased RV systolic function, no late gadolinium enhancement.  - RHC/LHC (12/20): mean RA 1, PA 14/2, mean PCWP 2, CI 2.2.  Distal LAD narrows without severe discrete stenosis, 40% ostial D1, diffuse luminal irregularities.   - Echo (2/21): EF 20-25%, global HK, RV normal.  - Echo (3/21): EF 30-35%, global HK, normal RV, no pericardial effusion.  6. CAD: Coronary angiography in 12/20 showed that distal LAD narrows without severe discrete stenosis, 40% ostial D1, diffuse luminal irregularities.  7. Acute pericarditis with pericardial tamponade: 2/21, had pericardiocentesis.   SH: Married, works for a [employer], originally from [place], history of ETOH abuse and smoking.   FH: Father with CAD at age 20, mother with stents in her 30s.   ROS: All systems reviewed and negative except as per HPI.   Current Outpatient Medications  Medication Sig Dispense Refill  . acetaminophen (TYLENOL) 650 MG CR tablet Take 650 mg by mouth every 8 (eight) hours as needed for pain.    . carvedilol (COREG) 6.25 MG tablet Take 1 tablet (6.25 mg total) by mouth 2 (two) times daily. 60 tablet 4  . [facility] ASPIRIN ADULT LOW DOSE 81 MG chewable tablet SMARTSIG:1 Tablet(s) By Mouth Daily    .  digoxin (LANOXIN) 0.125 MG tablet Take 1 tablet (0.125 mg total) by mouth daily. 30 tablet 2  . fenofibrate (TRICOR) 48 MG tablet Take 1 tablet (48 mg total) by mouth daily. 30 tablet 6  . folic acid (FOLVITE) 749 MCG tablet Take 400 mcg by mouth daily.    . Multiple Vitamin (MULTIVITAMIN WITH MINERALS) TABS tablet Take 1 tablet by mouth daily.    Marland Kitchen omega-3 acid ethyl esters (LOVAZA) 1 g capsule Take 2 capsules (2 g total) by mouth 2 (two) times daily. 120 capsule 3  . pantoprazole  (PROTONIX) 40 MG tablet Take 1 tablet (40 mg total) by mouth 2 (two) times daily for 14 days. Return to 1 tablet daily after 14 days 28 tablet 0  . rosuvastatin (CRESTOR) 40 MG tablet Take 1 tablet (40 mg total) by mouth daily at 6 PM. 30 tablet 2  . spironolactone (ALDACTONE) 25 MG tablet Take 1 tablet (25 mg total) by mouth at bedtime. 30 tablet 6  . thiamine 250 MG tablet Take 125 mg by mouth daily.    . sacubitril-valsartan (ENTRESTO) 97-103 MG Take 1 tablet by mouth 2 (two) times daily. 60 tablet 5   No current facility-administered medications for this encounter.   BP 112/82   Pulse 87   Wt 77.3 kg (170 lb 6.4 oz)   SpO2 99%   BMI 24.45 kg/m  General: NAD Neck: No JVD, no thyromegaly or thyroid nodule.  Lungs: Clear to auscultation bilaterally with normal respiratory effort. CV: Nondisplaced PMI.  Heart regular S1/S2, no S3/S4, no murmur.  No peripheral edema.  No carotid bruit.  Normal pedal pulses.  Abdomen: Soft, nontender, no hepatosplenomegaly, no distention.  Skin: Intact without lesions or rashes.  Neurologic: Alert and oriented x 3.  Psych: Normal affect. Extremities: No clubbing or cyanosis.  HEENT: Normal.   Assessment/Plan: 1. Chronic systolic CHF: Echo at [facility] in 12/20 with EF 25-30%, normal RV. Cardiolite showed EF 15% with fixed defect. LHC/RHC 12/20 showed nonobstructive coronary disease and low filling pressures, CI 2.2. Had pleuritic chest pain possibly consistent with perimyocarditis however hs troponin not elevated and cRMI in 12/20 w/o evidence of myocarditis or infiltrative disease. Cardiomyopathy may also be related to heavy ETOH use.  Episode of acute pericarditis in 2/21 concerns me that the initial lesion may have been perimyocarditis.  NYHA class II symptoms now.  Not volume overloaded on exam.  - Increase Entresto to 97/103 bid.  BMET today and again in 2 wks.  - He does not need a diuretic.  - Continue digoxin 0.125 daily, check level today.  -  Continue spironolactone 25 daily.   - Continue Coreg 6.25 mg bid.   - Repeat echo after about 6 months for ?ICD (6/21). Narrow QRS, not CRT candidate.  2. 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 FH of CAD, markedly high lipids, smoker.  - Will need ASA 81 long-term.  - Started on Crestor 40 mg daily. Good lipids in 2/21.   3. Hyperlipidemia: LDL 260, HDL 14, TGs 625 (but 4792 when checked at [facility]). 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  - Started on Crestor 40 mg daily, LDL much better in 2/21.  - Started Lovaza (unable to afford Vascepa) and fenofibrate => will use lower dose of fenofibrate (45 mg daily) given small risk of interaction with Crestor. TGs much improved on lipids in 2/21.  - He is being followed in lipid clinic.   4. Acute pancreatitis: ?Due to ETOH vs elevated triglycerides vs both. Resolved.  5 Smoking: He has quit.  6. ETOH abuse:  He has quit, imperative to stay off ETOH as I suspect that it was a major contributor to his cardiomyopathy.  7. Acute pericarditis: May have had pericarditis in 12/20, then with acute pericarditis in 2/21.  Chest pain has resolved.  No pericardial effusion on 3/21 echo.  - Continue colchicine 0.6 bid x 3 months, can stop in 5/21.  - Check ESR and CRP to see if these are coming down.   Followup in 2 wks with pharmacist for med titration, see me in 6/21 with echo.    Loralie Champagne 10/27/2019

## 2019-11-03 ENCOUNTER — Telehealth (HOSPITAL_COMMUNITY): Payer: Self-pay | Admitting: Pharmacist

## 2019-11-03 MED ORDER — DIGOXIN 125 MCG PO TABS: 0.1250 mg | ORAL_TABLET | Freq: Every day | ORAL | 6 refills | Status: DC

## 2019-11-03 MED ORDER — PANTOPRAZOLE SODIUM 40 MG PO TBEC: 40.0000 mg | DELAYED_RELEASE_TABLET | Freq: Every day | ORAL | 1 refills | Status: DC

## 2019-11-03 NOTE — Telephone Encounter (Signed)
[facility]

## 2019-11-16 ENCOUNTER — Ambulatory Visit (HOSPITAL_COMMUNITY)
Admission: RE | Admit: 2019-11-16 | Discharge: 2019-11-16 | Disposition: A | Discharge status: Home / Self Care | Payer: Commercial Managed Care - PPO | Source: Ambulatory Visit | Attending: Cardiology | Admitting: Cardiology

## 2019-11-16 ENCOUNTER — Encounter (HOSPITAL_COMMUNITY): Payer: Self-pay

## 2019-11-16 ENCOUNTER — Other Ambulatory Visit: Payer: Self-pay

## 2019-11-16 VITALS — BP 112/74 | HR 95 | Wt 166.6 lb

## 2019-11-16 DIAGNOSIS — I519 Heart disease, unspecified: Secondary | ICD-10-CM

## 2019-11-16 DIAGNOSIS — Z79899 Other long term (current) drug therapy: Secondary | ICD-10-CM | POA: Diagnosis not present

## 2019-11-16 DIAGNOSIS — Z8719 Personal history of other diseases of the digestive system: Secondary | ICD-10-CM | POA: Diagnosis not present

## 2019-11-16 DIAGNOSIS — Z87891 Personal history of nicotine dependence: Secondary | ICD-10-CM | POA: Diagnosis not present

## 2019-11-16 DIAGNOSIS — Z8249 Family history of ischemic heart disease and other diseases of the circulatory system: Secondary | ICD-10-CM | POA: Insufficient documentation

## 2019-11-16 DIAGNOSIS — I426 Alcoholic cardiomyopathy: Secondary | ICD-10-CM

## 2019-11-16 DIAGNOSIS — I2510 Atherosclerotic heart disease of native coronary artery without angina pectoris: Secondary | ICD-10-CM | POA: Insufficient documentation

## 2019-11-16 DIAGNOSIS — E785 Hyperlipidemia, unspecified: Secondary | ICD-10-CM | POA: Diagnosis not present

## 2019-11-16 DIAGNOSIS — I5022 Chronic systolic (congestive) heart failure: Secondary | ICD-10-CM | POA: Diagnosis not present

## 2019-11-16 DIAGNOSIS — F1011 Alcohol abuse, in remission: Secondary | ICD-10-CM | POA: Diagnosis not present

## 2019-11-16 DIAGNOSIS — Z7982 Long term (current) use of aspirin: Secondary | ICD-10-CM | POA: Diagnosis not present

## 2019-11-16 DIAGNOSIS — M79673 Pain in unspecified foot: Secondary | ICD-10-CM | POA: Insufficient documentation

## 2019-11-16 LAB — BASIC METABOLIC PANEL
Anion gap: 16 — ABNORMAL HIGH (ref 5–15)
BUN: 14 mg/dL (ref 6–20)
CO2: 23 mmol/L (ref 22–32)
Calcium: 9.1 mg/dL (ref 8.9–10.3)
Chloride: 98 mmol/L (ref 98–111)
Creatinine, Ser: 1.16 mg/dL (ref 0.61–1.24)
GFR calc Af Amer: >60 mL/min (ref >60)
GFR calc non Af Amer: >60 mL/min (ref >60)
Glucose, Bld: 112 mg/dL — ABNORMAL HIGH (ref 70–99)
Potassium: 3.6 mmol/L (ref 3.5–5.1)
Sodium: 137 mmol/L (ref 135–145)

## 2019-11-16 MED ORDER — CARVEDILOL 12.5 MG PO TABS: 12.5000 mg | ORAL_TABLET | Freq: Two times a day (BID) | ORAL | 4 refills | Status: DC

## 2019-11-16 NOTE — Patient Instructions (Signed)
It was a pleasure seeing you today!  MEDICATIONS: -We are changing your medications today -Increase carvedilol to 1 tablet (12.5 mg) by mouth two times daily -Call if you have questions about your medications.  LABS: -We will call you if your labs need attention.  NEXT APPOINTMENT: Return to clinic in 4 weeks with Pharmacist.  In general, to take care of your heart failure: -Limit your fluid intake to 2 Liters (half-gallon) per day.   -Limit your salt intake to ideally 2-3 grams (2000-3000 mg) per day. -Weigh yourself daily and record, and bring that "weight diary" to your next appointment.  (Weight gain of 2-3 pounds in 1 day typically means fluid weight.) -The medications for your heart are to help your heart and help you live longer.   -Please contact us before stopping any of your heart medications.  Call the clinic at [phone] with questions or to reschedule future appointments.

## 2019-11-16 NOTE — Progress Notes (Signed)
Cardiology: Dr. Shirlee Latch  HPI:  39 y.o.with history of ETOH abuse and smoking, nonobstructive CAD, and primarily nonischemic cardiomyopathy presents for follow up of CHF. Patient has a strong family history of CAD, both his mother and father had CAD diagnosed in their 66s. He moved to [place] about a year ago from [place] for his job. Initially, he was here without his family and started drinking quite heavily. He also has smoked chronically. He was admitted in 12/20 with pleuritic chest pain and abdominal pain. Amylase and lipase were high suggesting acute pancreatitis, due to ETOH but also with possible contribution from very high triglycerides (4000 range initially). Echo was done, showing EF 25%. Patient was initially admitted to [facility], but at this point was transferred to [facility]. CMRI showed EF 10% with no LGE. Cath showed low filling pressures and age-advanced CAD but no tight blockages. No arrhythmias were noted while in the hospital. He was started slowly on cardiac meds (due to soft BP) and discharged home.   He was re-admitted in 2/21 with sudden onset pleuritic chest pain concerning for acute pericarditis. Echo showed pericardial tamponade and he had pericardiocentesis. Echo after pericardial drain was removed showed EF 20-25%, global hypokinesis, normal RV. He was started on high dose ASA and colchicine.  He was seen by Dr. Shirlee Latch on 09/08/19 and his carvedilol was increased to 6.25 mg BID. He was then seen by PharmD on 10/05/19 and his Sherryll Burger was increased to 49/51 mg BID. He was last seen by Dr. Shirlee Latch on 10/26/19 and his Sherryll Burger was increased again to 97/103 mg BID.  Today he returns to HF clinic for pharmacist medication titration. Overall, he is feeling good and denies dizziness, lightheadedness, fatigue, chest pain, or palpitations. He says his breathing is good and denies shortness of breath. He is able to complete all ADLs. His activity level is improving and he says he has started  playing golf again. His reported home weight is around 165-170 lbs. No PND/orthopnea. His appetite is good. He reports experiencing pain on the side of his foot especially when he puts weight on it. He says it has been on and off for the last few months and will typically last about 2-3 days before it improves.  . Shortness of breath/dyspnea on exertion? no  . Orthopnea/PND? no . Edema? no . Lightheadedness/dizziness? no . Daily weights at home? yes . Blood pressure/heart rate monitoring at home? yes . Following low-sodium/fluid-restricted diet? Yes - Hello Fresh  HF Medications: Carvedilol 6.25 mg BID Entresto 97/103 mg BID Spironolactone 25 mg daily Digoxin 0.125 mg daily  Has the patient been experiencing any side effects to the medications prescribed?  no  Does the patient have any problems obtaining medications due to transportation or finances?   No - has Nurse, learning disability  Understanding of regimen: good Understanding of indications: good Potential of compliance: good Patient understands to avoid NSAIDs. Patient understands to avoid decongestants.    Pertinent Lab Values (10/26/19): Marland Kitchen Serum creatinine 0.94, BUN 14, Potassium 4.0, Sodium 136, Digoxin 0.2 ng/mL  Vital Signs: . Weight: 166 lb (last clinic weight: 170 lbs) . Blood pressure: 112/74 mmHg . Heart rate: 95 bpm   Assessment: 1. Chronic systolic CHF: Echo at [facility] in 12/20 with EF 25-30%, normal RV. Cardiolite showed EF 15% with fixed defect. LHC/RHC 12/20 showed nonobstructive coronary disease and low filling pressures, CI 2.2. Had pleuritic chest pain possibly consistent with perimyocarditishoweverhs troponin not elevatedand cRMI in 12/20 w/o evidence of myocarditis or infiltrative  disease.  Cardiomyopathy may also be related to heavy ETOH use.  Episode of acute pericarditis in 2/21 suggestive that the initial lesion may have been perimyocarditis. Repeat ECHO with improved EF 30-35%.  -NYHA class II  symptoms now.  Not volume overloaded on exam.  - Vitals: BP 112/74, HR 95 - BMET today - Increase carvedilol to 12.5 mg BID - Continue Entresto 97/103 mg BID - Continue spironolactone 25 mg daily - Continue digoxin 0.125 mg daily - Discussed adding Farxiga at next visit - pt willing to start taking at that time. Can provide coupon card for copay assistance since he has commercial insurance.  2. 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 FH of CAD, markedly high lipids, smoker.  - Will need ASA 81 long-term.  - Continue Crestor 40 mg daily.Good lipids in 2/21.  3. Hyperlipidemia: LDL 260, HDL 14, TGs 625 (but 4792 when checked at [facility]). 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  - Started on Crestor 40 mg daily,LDL much betterin 2/21.  - Started Lovaza and fenofibrate => lower dose of fenofibrate (45 mg daily) given small risk of interaction with Crestor. Was not able to afford Vascepa. TGs much improved on lipids in 2/21. -He is being followed in lipid clinic.  4. Acute pancreatitis: ?Due to ETOH vs elevated triglycerides vs both. Resolved.   5Smoking: He has quit.   6. ETOH abuse: He has quit, imperative to stay off ETOH as it is suspected that it was a major contributor to his cardiomyopathy.   7. Acute pericarditis: May have had pericarditis in 12/20, then with acute pericarditis in 2/21. Chest pain has resolved.  - Continued ASA (completed 3 weeks of high-dose aspirin) - Has stopped colchicine last week - he was instructed to stop when he ran out of tablets from his last refill  8. Foot pain: concern for gout vs pain from poor footwear.  - He would like to monitor it for now but if this persists, he is willing to have his uric acid level checked   Plan: 1) Medication changes: Based on clinical presentation, vital signs and recent labs will increase carvedilol to 12.5 mg BID. Plan to starting Farxiga at next visit.  2) Labs: BMET today 3) Follow-up: 4 weeks with  PharmD; 01/14/20 with Dr. Johny Shock, PharmD, BCPS Heart Failure Clinic Pharmacist [phone]  Audry Riles, PharmD, BCPS, [facility], CPP Heart Failure Clinic Pharmacist [phone]

## 2019-11-19 ENCOUNTER — Encounter (HOSPITAL_COMMUNITY): Payer: Self-pay | Admitting: Emergency Medicine

## 2019-11-19 ENCOUNTER — Telehealth (HOSPITAL_COMMUNITY): Payer: Self-pay | Admitting: *Deleted

## 2019-11-19 ENCOUNTER — Other Ambulatory Visit: Payer: Self-pay

## 2019-11-19 ENCOUNTER — Emergency Department (HOSPITAL_COMMUNITY): Payer: Commercial Managed Care - PPO

## 2019-11-19 ENCOUNTER — Emergency Department (HOSPITAL_COMMUNITY)
Admission: EM | Admit: 2019-11-19 | Discharge: 2019-11-19 | Disposition: A | Discharge status: Home / Self Care | Payer: Commercial Managed Care - PPO | Attending: Emergency Medicine | Admitting: Emergency Medicine

## 2019-11-19 DIAGNOSIS — Z79899 Other long term (current) drug therapy: Secondary | ICD-10-CM | POA: Insufficient documentation

## 2019-11-19 DIAGNOSIS — R42 Dizziness and giddiness: Secondary | ICD-10-CM | POA: Diagnosis not present

## 2019-11-19 DIAGNOSIS — I252 Old myocardial infarction: Secondary | ICD-10-CM | POA: Insufficient documentation

## 2019-11-19 DIAGNOSIS — R079 Chest pain, unspecified: Secondary | ICD-10-CM | POA: Diagnosis present

## 2019-11-19 DIAGNOSIS — R071 Chest pain on breathing: Secondary | ICD-10-CM | POA: Diagnosis not present

## 2019-11-19 DIAGNOSIS — Z87891 Personal history of nicotine dependence: Secondary | ICD-10-CM | POA: Insufficient documentation

## 2019-11-19 DIAGNOSIS — M25511 Pain in right shoulder: Secondary | ICD-10-CM | POA: Insufficient documentation

## 2019-11-19 DIAGNOSIS — M549 Dorsalgia, unspecified: Secondary | ICD-10-CM | POA: Insufficient documentation

## 2019-11-19 DIAGNOSIS — R0602 Shortness of breath: Secondary | ICD-10-CM | POA: Diagnosis not present

## 2019-11-19 DIAGNOSIS — Z7982 Long term (current) use of aspirin: Secondary | ICD-10-CM | POA: Diagnosis not present

## 2019-11-19 DIAGNOSIS — M25512 Pain in left shoulder: Secondary | ICD-10-CM | POA: Insufficient documentation

## 2019-11-19 HISTORY — DX: Old myocardial infarction: I25.2

## 2019-11-19 LAB — BASIC METABOLIC PANEL
Anion gap: 12 (ref 5–15)
BUN: 13 mg/dL (ref 6–20)
CO2: 22 mmol/L (ref 22–32)
Calcium: 9 mg/dL (ref 8.9–10.3)
Chloride: 101 mmol/L (ref 98–111)
Creatinine, Ser: 1.46 mg/dL — ABNORMAL HIGH (ref 0.61–1.24)
GFR calc Af Amer: >60 mL/min (ref >60)
GFR calc non Af Amer: >60 mL/min (ref >60)
Glucose, Bld: 158 mg/dL — ABNORMAL HIGH (ref 70–99)
Potassium: 3.9 mmol/L (ref 3.5–5.1)
Sodium: 135 mmol/L (ref 135–145)

## 2019-11-19 LAB — TROPONIN I (HIGH SENSITIVITY)
Troponin I (High Sensitivity): 5 ng/L (ref <18)
Troponin I (High Sensitivity): 7 ng/L (ref <18)

## 2019-11-19 LAB — CBC
HCT: 37.8 % — ABNORMAL LOW (ref 39.0–52.0)
Hemoglobin: 12.4 g/dL — ABNORMAL LOW (ref 13.0–17.0)
MCH: 30.1 pg (ref 26.0–34.0)
MCHC: 32.8 g/dL (ref 30.0–36.0)
MCV: 91.7 fL (ref 80.0–100.0)
Platelets: 411 10*3/uL — ABNORMAL HIGH (ref 150–400)
RBC: 4.12 MIL/uL — ABNORMAL LOW (ref 4.22–5.81)
RDW: 12.8 % (ref 11.5–15.5)
WBC: 11 10*3/uL — ABNORMAL HIGH (ref 4.0–10.5)
nRBC: 0 % (ref 0.0–0.2)

## 2019-11-19 MED ORDER — SODIUM CHLORIDE 0.9% FLUSH
3.0000 mL | Freq: Once | INTRAVENOUS | Status: AC
  Administered 2019-11-19: 16:00:00 3 mL via INTRAVENOUS

## 2019-11-19 MED ORDER — SODIUM CHLORIDE 0.9 % IV BOLUS
1000.0000 mL | Freq: Once | INTRAVENOUS | Status: AC
  Administered 2019-11-19: 1000 mL via INTRAVENOUS

## 2019-11-19 MED ORDER — ASPIRIN 81 MG PO CHEW
324.0000 mg | CHEWABLE_TABLET | Freq: Once | ORAL | Status: AC
  Administered 2019-11-19: 16:00:00 324 mg via ORAL
  Filled 2019-11-19: qty 4

## 2019-11-19 MED ORDER — MORPHINE SULFATE (PF) 2 MG/ML IV SOLN
2.0000 mg | Freq: Once | INTRAVENOUS | Status: AC
  Administered 2019-11-19: 16:00:00 2 mg via INTRAVENOUS
  Filled 2019-11-19: qty 1

## 2019-11-19 MED ORDER — HYDROCODONE-ACETAMINOPHEN 5-325 MG PO TABS
1.0000 | ORAL_TABLET | Freq: Once | ORAL | Status: AC
  Administered 2019-11-19: 19:00:00 1 via ORAL
  Filled 2019-11-19: qty 1

## 2019-11-19 MED ORDER — IOHEXOL 350 MG/ML SOLN
100.0000 mL | Freq: Once | INTRAVENOUS | Status: AC | PRN
  Administered 2019-11-19: 19:00:00 100 mL via INTRAVENOUS

## 2019-11-19 MED ORDER — NITROGLYCERIN 0.4 MG SL SUBL
0.4000 mg | SUBLINGUAL_TABLET | SUBLINGUAL | Status: DC | PRN
  Administered 2019-11-19: 16:00:00 0.4 mg via SUBLINGUAL
  Filled 2019-11-19: qty 1

## 2019-11-19 NOTE — ED Triage Notes (Signed)
Pt states he has had intermittent left shoulder pain, chest pain to mid/left chest since Sunday, now has dizziness with exertion and shortness of breath. Hx of MI. Pain currently 5/10.

## 2019-11-19 NOTE — Telephone Encounter (Signed)
Pt called c/o chest pain and tightness over the past few days.Pt said chest pain has become increasingly worse he now has pain in his back as well especially when he tries to take a deep breath.  Pt also c/o becoming " winded" when experiencing chest pain. While speaking to patient on the phone I could hear he was experiencing shortness of breath. I advised patient to go the emergency room pt agreed that he should be evaluated in the ED. Pt declined EMS and said he would have someone take him to the hospital in a private vehicle.

## 2019-11-19 NOTE — Discharge Instructions (Signed)
Your workup today was reasurring and negative.  Please make sure to follow up with your cardiologist within the week Return to the ER if your symptoms worsen

## 2019-11-19 NOTE — ED Provider Notes (Addendum)
[facility] EMERGENCY DEPARTMENT Provider Note   CSN: [id number] Arrival date & time: 11/19/19  1134     History Chief Complaint  Patient presents with  . Chest Pain    Mateo Overbeck is a 39 y.o. male.  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  Patient reports that he was working in the yard on Sunday and started to have some bilateral shoulder pain and back pain.  He attributed this to the strenuous physical activity he was doing.  He said on Monday he began to have chest pain which progressively got worse over the next few days.  Endorses exertional shortness of breath, worse on inspiration.  Endorses an episode of lightheaded and dizziness, nausea this morning which prompted him to come to the ER.  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  He is not on any blood thinners other than a 81 mg aspirin.  He has not taken anything for his pain.  Rates it a 5/10.  He states he has been following with Dr. Shirlee Latch with cardiology in [place] and is compliant with his medications.  No fevers, chills, vomiting, abdominal pain, dysuria, hematuria, diarrhea, diaphoresis, syncope, fall.    Past Medical History:  Diagnosis Date  . Anginal pain (HCC)   . Hx of myocardial infarction     Patient Active Problem List   Diagnosis Date Noted  . Cardiac/pericardial tamponade 08/30/2019  . Cardiac tamponade 08/30/2019  . Hyperlipidemia 07/31/2019  . Systolic dysfunction 07/20/2019  . Alcoholic cardiomyopathy (HCC) 95/62/1308  . NSTEMI (non-ST elevated myocardial infarction) (HCC) 07/20/2019  . Tobacco abuse 07/20/2019  . Pancreatitis, acute 07/20/2019  . Hypertriglyceridemia 07/20/2019    Past Surgical History:    Procedure Laterality Date  . PERICARDIOCENTESIS N/A 08/30/2019   Procedure: PERICARDIOCENTESIS;  Surgeon: Tonny Bollman, MD;  Location: [facility] INVASIVE CV LAB;  Service: Cardiovascular;  Laterality: N/A;  . RIGHT/LEFT HEART CATH AND CORONARY ANGIOGRAPHY N/A 07/21/2019   Procedure: RIGHT/LEFT HEART CATH AND CORONARY ANGIOGRAPHY;  Surgeon: Laurey Morale, MD;  Location: [facility] INVASIVE CV LAB;  Service: Cardiovascular;  Laterality: N/A;       Family History  Problem Relation Age of Onset  . Coronary artery disease Mother 35       Multiple coronary stents  . Coronary artery disease Father 74       Quadruple bypass    Social History   Tobacco Use  . Smoking status: Former Smoker    Types: Cigarettes  . Smokeless tobacco: Never Used  Substance Use Topics  . Alcohol use: Yes    Comment: 1 pint of liquor daily  . Drug use: Not on file    Home Medications Prior to Admission medications   Medication Sig Start Date End Date Taking? Authorizing Provider  carvedilol (COREG) 12.5 MG tablet Take 1 tablet (12.5 mg total) by mouth 2 (two) times daily. 11/16/19  Yes Laurey Morale, MD  [employer] ASPIRIN ADULT LOW DOSE 81 MG chewable tablet SMARTSIG:1 Tablet(s) By Mouth Daily 10/19/19  Yes [provider]  digoxin (LANOXIN) 0.125 MG tablet Take 1 tablet (0.125 mg total) by mouth daily. 11/03/19  Yes Laurey Morale, MD  fenofibrate 160 MG tablet Take 80 mg by mouth daily. 10/18/19  Yes [provider]  folic acid (FOLVITE) 800 MCG tablet Take 800 mcg by mouth daily.    Yes [provider]  Multiple Vitamin (MULTIVITAMIN WITH MINERALS) TABS tablet Take 1 tablet by mouth daily. 07/21/19  Yes Alwyn Ren, MD  omega-3 acid ethyl esters (LOVAZA) 1 g capsule Take 2 capsules (2 g total) by mouth 2 (two) times daily. 09/18/19  Yes Bensimhon, Bevelyn Buckles, MD  pantoprazole (PROTONIX) 40 MG tablet Take 1 tablet (40 mg total) by mouth daily. 11/03/19  Yes Laurey Morale, MD   rosuvastatin (CRESTOR) 40 MG tablet Take 1 tablet (40 mg total) by mouth daily at 6 PM. 07/23/19  Yes Hall, Carole N, DO  sacubitril-valsartan (ENTRESTO) 97-103 MG Take 1 tablet by mouth 2 (two) times daily. 10/26/19  Yes Laurey Morale, MD  spironolactone (ALDACTONE) 25 MG tablet Take 1 tablet (25 mg total) by mouth at bedtime. 07/27/19  Yes Laurey Morale, MD  thiamine 250 MG tablet Take 125 mg by mouth daily.   Yes [provider]    Allergies    Patient has no known allergies.  Review of Systems   Review of Systems  Constitutional: Negative for chills and fever.  HENT: Negative for ear pain and sore throat.   Eyes: Negative for pain and visual disturbance.  Respiratory: Positive for shortness of breath. Negative for cough.   Cardiovascular: Positive for chest pain and palpitations. Negative for leg swelling.  Gastrointestinal: Positive for nausea. Negative for abdominal pain, diarrhea and vomiting.  Genitourinary: Negative for dysuria, flank pain and hematuria.  Musculoskeletal: Positive for back pain. Negative for arthralgias.  Skin: Negative for color change and rash.  Neurological: Positive for dizziness and light-headedness. Negative for seizures, syncope and weakness.  Psychiatric/Behavioral: Negative for confusion.  All other systems reviewed and are negative.   Physical Exam Updated Vital Signs BP 120/69   Pulse 73   Temp 98.4 F (36.9 C)   Resp 15   SpO2 97%   Physical Exam Vitals and nursing note reviewed.  Constitutional:      General: He is not in acute distress.    Appearance: He is well-developed. He is not ill-appearing, toxic-appearing or diaphoretic.  HENT:     Head: Normocephalic and atraumatic.  Eyes:     Extraocular Movements: Extraocular movements intact.     Conjunctiva/sclera: Conjunctivae normal.     Pupils: Pupils are equal, round, and reactive to light.  Neck:     Trachea: No tracheal deviation.  Cardiovascular:     Rate and  Rhythm: Normal rate and regular rhythm.     Pulses:          Carotid pulses are 2+ on the right side and 2+ on the left side.      Radial pulses are 2+ on the right side and 2+ on the left side.       Dorsalis pedis pulses are 2+ on the right side and 2+ on the left side.     Heart sounds: Normal heart sounds. No murmur.  Pulmonary:     Effort: Pulmonary effort is normal. No respiratory distress.     Breath sounds: No decreased breath sounds, wheezing, rhonchi or rales.  Chest:     Chest wall: No mass or tenderness.  Abdominal:     General: Bowel sounds are normal.     Palpations: Abdomen is soft.     Tenderness: There is no abdominal tenderness.  Musculoskeletal:  General: Normal range of motion.     Cervical back: Normal range of motion and neck supple.     Right lower leg: Tenderness present.     Comments: Tenderness over right trapezius muscle  Skin:    General: Skin is warm and dry.  Neurological:     General: No focal deficit present.     Mental Status: He is alert.  Psychiatric:        Mood and Affect: Mood normal.        Behavior: Behavior normal.     ED Results / Procedures / Treatments   Labs (all labs ordered are listed, but only abnormal results are displayed) Labs Reviewed  BASIC METABOLIC PANEL - Abnormal; Notable for the following components:      Result Value   Glucose, Bld 158 (*)    Creatinine, Ser 1.46 (*)    All other components within normal limits  CBC - Abnormal; Notable for the following components:   WBC 11.0 (*)    RBC 4.12 (*)    Hemoglobin 12.4 (*)    HCT 37.8 (*)    Platelets 411 (*)    All other components within normal limits  TROPONIN I (HIGH SENSITIVITY)  TROPONIN I (HIGH SENSITIVITY)    EKG EKG Interpretation  Date/Time:  Thursday November 19 2019 11:35:45 EDT Ventricular Rate:  102 PR Interval:  140 QRS Duration: 100 QT Interval:  360 QTC Calculation: 469 R Axis:   54 Text Interpretation: Sinus tachycardia T wave  abnormality, consider inferolateral ischemia Abnormal ECG no acute change when compared to prior Confirmed by Marianna Fuss ([id number]) on 11/19/2019 3:05:12 PM   Radiology DG Chest 2 View  Result Date: 11/19/2019 CLINICAL DATA:  Chest pain mid to LEFT chest rated 5/10, intermittent LEFT shoulder pain, dizziness with exertion, shortness of breath, history MI EXAM: CHEST - 2 VIEW COMPARISON:  08/31/2019 FINDINGS: Normal heart size, mediastinal contours, and pulmonary vascularity. Mild peribronchial thickening. No pulmonary infiltrate, pleural effusion, or pneumothorax. Old healed fractures of the posterior RIGHT sixth and seventh ribs. No acute osseous findings. IMPRESSION: Bronchitic changes without infiltrate. Electronically Signed   By: Ulyses Southward M.D.   On: 11/19/2019 11:59   CT Angio Chest PE W and/or Wo Contrast  Result Date: 11/19/2019 CLINICAL DATA:  Chest pain, nonspecific. Shortness of breath. Intermittent LEFT shoulder pain since Sunday. Dizziness with exertion and shortness of breath. EXAM: CT ANGIOGRAPHY CHEST WITH CONTRAST TECHNIQUE: Multidetector CT imaging of the chest was performed using the standard protocol during bolus administration of intravenous contrast. Multiplanar CT image reconstructions and MIPs were obtained to evaluate the vascular anatomy. CONTRAST:  OMNIPAQUE IOHEXOL 350 MG/ML SOLN COMPARISON:  08/30/2019, 11/19/2019 chest x-ray FINDINGS: Cardiovascular: Moderate pericardial effusion measures 1.8 centimeters. The appearance is stable compared with the prior study. The heart is mildly enlarged. There is atherosclerotic calcification of the coronary arteries. The pulmonary arteries are well opacified by contrast bolus and there is no acute pulmonary embolus. Mediastinum/Nodes: The visualized portion of the thyroid gland has a normal appearance. No significant mediastinal, hilar, or axillary adenopathy. Esophagus is unremarkable. Lungs/Pleura: Apical bullous changes,  paraseptal emphysematous changes at the lung apices, RIGHT greater than LEFT. There is a 7 x 8 millimeter nodule within the superior segment of the RIGHT LOWER lobe, best seen on image 72 of series 6. Appearance is stable since 07/17/2019. There is bibasilar atelectasis. No focal consolidations. No pleural effusions. No evidence for pulmonary edema. Upper Abdomen: Liver is diffusely low  attenuation consistent with hepatic steatosis. Rounded contour of the liver and prominent caudate lobe also suggest cirrhotic morphology. Gallbladder is present. No acute abnormality. Musculoskeletal: Visualized osseous structures have a normal appearance. Remote rib fractures. Review of the MIP images confirms the above findings. IMPRESSION: 1. Technically adequate exam showing no acute pulmonary embolus. 2. Stable appearance of moderate pericardial effusion. 3. Mild cardiomegaly and coronary artery disease. 4. Stable appearance of 7 x 8 millimeter nodule in the superior segment of the RIGHT LOWER lobe. Mass shows 4 months of stability. Recommend follow-up chest CT in 12-18 months to assess for stability. 5. Hepatic steatosis and possible cirrhosis. 6. Emphysema (ICD10-J43.9). Electronically Signed   By: Nolon Nations M.D.   On: 11/19/2019 19:29    Procedures Procedures (including critical care time)  Medications Ordered in ED Medications  nitroGLYCERIN (NITROSTAT) SL tablet 0.4 mg (0.4 mg Sublingual Given 11/19/19 1548)  sodium chloride flush (NS) 0.9 % injection 3 mL (3 mLs Intravenous Given 11/19/19 1549)  sodium chloride 0.9 % bolus 1,000 mL (0 mLs Intravenous Stopped 11/19/19 1841)  aspirin chewable tablet 324 mg (324 mg Oral Given 11/19/19 1547)  morphine 2 MG/ML injection 2 mg (2 mg Intravenous Given 11/19/19 1548)  HYDROcodone-acetaminophen (NORCO/VICODIN) 5-325 MG per tablet 1 tablet (1 tablet Oral Given 11/19/19 1840)  iohexol (OMNIPAQUE) 350 MG/ML injection 100 mL (100 mLs Intravenous Contrast Given 11/19/19  1908)    ED Course  I have reviewed the triage vital signs and the nursing notes.  Pertinent labs & imaging results that were available during my care of the patient were reviewed by me and considered in my medical decision making (see chart for details).  Clinical Course as of Nov 18 2017  Thu Nov 19, 2451  3744 39 year old male with a history of MI, cardiac tamponade, and alcoholic cardiomyopathy  presents with a 3-day history of chest pain. On presentation to the ER, patient is well-appearing, in no acute distress, nondiaphoretic, slight peaking in full sentences without increased work of breathing.  He continues to have some mild chest pain but does not endorse any shortness of breath.  Physical exam negative for acute findings.  He does have some tenderness to palpation over the right trapezius muscle.  No midline tenderness.  CBC with mild leukocytosis at 11, mildly decreased hemoglobin of 12.4.  BMP without significant electrolyte normalities, mildly elevated glucose at 158.  he does seem to have an AKI as his creatinine today is 1.46 as his creatinine 3 days ago was 1.16.  Troponin negative, EKG unchanged from previous.  Concern for possible repeat tamponade, PE as the patient does endorse pleuritic chest pain//possible dissection.  Patient received pain medication, nitro, aspirin, fluid bolus   [MB]  1653 Heart score of 4   [MB]  1930 CT PE  IMPRESSION: 1. Technically adequate exam showing no acute pulmonary embolus. 2. Stable appearance of moderate pericardial effusion. 3. Mild cardiomegaly and coronary artery disease. 4. Stable appearance of 7 x 8 millimeter nodule in the superior segment of the RIGHT LOWER lobe. Mass shows 4 months of stability. Recommend follow-up chest CT in 12-18 months to assess for stability. 5. Hepatic steatosis and possible cirrhosis. 6. Emphysema (ICD10-J43.9).      [MB]  2000 Patient is overall well-appearing, vitals reassuring, is in no acute  distress.  Suspicion for dissection low, I do not think a CT dissection study is indicated at this time.   [MB]  2015 Overall work-up reassuring.  Patient notes improvement in  chest pain and shortness of breath.  Well-appearing at discharge.  Encourage close follow-up with cardiologist within the week.  Patient voices understanding is agreeable to this plan.  Patient seen and evaluated by Dr. Alta Corning agreeable to above plan.  Strict return precautions given.   [MB]    Clinical Course User Index [MB] Mare Ferrari, PA-C   MDM Rules/Calculators/A&P                      ADDENDUM - noted incidental finding of lung nodule, attempted to contact x 2 via phone, left VM, instructed to follow up with PCP regarding an incidental CT finding.  Final Clinical Impression(s) / ED Diagnoses Final diagnoses:  Shortness of breath  Chest pain, unspecified type    Rx / DC Orders ED Discharge Orders    None           Milagros Loll, MD 11/21/19 1150    Leone Brand 11/26/19 1035    Milagros Loll, MD 11/27/19 [phone]

## 2019-11-20 ENCOUNTER — Telehealth (HOSPITAL_COMMUNITY): Payer: Self-pay

## 2019-11-20 NOTE — Telephone Encounter (Signed)
Pt visited ED yesterday with chest pain/tightness. At request of MD, pt called to assess his condition today.  Called patient, he reports he no longer has chest tightness however has pain in upper chest, back and shoulders. He was told all test were negative in ED.  Per Dr Shirlee Latch as it appears to be muscular, advised to take aspirin 650mg  q8h. If no improvement, to call our office back. Verbalized understanding.

## 2019-12-14 ENCOUNTER — Inpatient Hospital Stay (HOSPITAL_COMMUNITY)
Admission: RE | Admit: 2019-12-14 | Discharge: 2019-12-14 | Disposition: A | Discharge status: Home / Self Care | Payer: Commercial Managed Care - PPO | Source: Ambulatory Visit

## 2020-01-02 ENCOUNTER — Other Ambulatory Visit (HOSPITAL_COMMUNITY): Payer: Self-pay | Admitting: Cardiology

## 2020-01-14 ENCOUNTER — Other Ambulatory Visit: Payer: Self-pay

## 2020-01-14 ENCOUNTER — Encounter (HOSPITAL_COMMUNITY): Payer: Self-pay | Admitting: Cardiology

## 2020-01-14 ENCOUNTER — Ambulatory Visit (HOSPITAL_COMMUNITY)
Admission: RE | Admit: 2020-01-14 | Discharge: 2020-01-14 | Disposition: A | Discharge status: Home / Self Care | Payer: Commercial Managed Care - PPO | Source: Ambulatory Visit | Attending: Cardiology | Admitting: Cardiology

## 2020-01-14 ENCOUNTER — Ambulatory Visit (HOSPITAL_BASED_OUTPATIENT_CLINIC_OR_DEPARTMENT_OTHER)
Admission: RE | Admit: 2020-01-14 | Discharge: 2020-01-14 | Disposition: A | Discharge status: Home / Self Care | Payer: Commercial Managed Care - PPO | Source: Ambulatory Visit | Attending: Cardiology | Admitting: Cardiology

## 2020-01-14 VITALS — BP 140/85 | HR 73 | Wt 169.8 lb

## 2020-01-14 DIAGNOSIS — Z87891 Personal history of nicotine dependence: Secondary | ICD-10-CM | POA: Diagnosis not present

## 2020-01-14 DIAGNOSIS — I428 Other cardiomyopathies: Secondary | ICD-10-CM | POA: Insufficient documentation

## 2020-01-14 DIAGNOSIS — Z79899 Other long term (current) drug therapy: Secondary | ICD-10-CM | POA: Diagnosis not present

## 2020-01-14 DIAGNOSIS — I2510 Atherosclerotic heart disease of native coronary artery without angina pectoris: Secondary | ICD-10-CM | POA: Insufficient documentation

## 2020-01-14 DIAGNOSIS — I519 Heart disease, unspecified: Secondary | ICD-10-CM

## 2020-01-14 DIAGNOSIS — E785 Hyperlipidemia, unspecified: Secondary | ICD-10-CM | POA: Insufficient documentation

## 2020-01-14 DIAGNOSIS — I5022 Chronic systolic (congestive) heart failure: Secondary | ICD-10-CM | POA: Diagnosis not present

## 2020-01-14 DIAGNOSIS — Z8249 Family history of ischemic heart disease and other diseases of the circulatory system: Secondary | ICD-10-CM | POA: Diagnosis not present

## 2020-01-14 DIAGNOSIS — F1011 Alcohol abuse, in remission: Secondary | ICD-10-CM | POA: Insufficient documentation

## 2020-01-14 LAB — BASIC METABOLIC PANEL
Anion gap: 11 (ref 5–15)
BUN: 5 mg/dL — ABNORMAL LOW (ref 6–20)
CO2: 26 mmol/L (ref 22–32)
Calcium: 9.2 mg/dL (ref 8.9–10.3)
Chloride: 104 mmol/L (ref 98–111)
Creatinine, Ser: 0.86 mg/dL (ref 0.61–1.24)
GFR calc Af Amer: >60 mL/min (ref >60)
GFR calc non Af Amer: >60 mL/min (ref >60)
Glucose, Bld: 96 mg/dL (ref 70–99)
Potassium: 3.6 mmol/L (ref 3.5–5.1)
Sodium: 141 mmol/L (ref 135–145)

## 2020-01-14 NOTE — Patient Instructions (Signed)
Stop Digoxin  Labs done today, we will contact you with any abnormal results  Labs needed in 3 months, we have provided you a prescription to have these done locally  Please call our office in December to schedule your follow up appointment  If you have any questions or concerns before your next appointment please send [place] a message through [place] or call our office at [phone].    TO LEAVE A MESSAGE FOR THE NURSE SELECT OPTION 2, PLEASE LEAVE A MESSAGE INCLUDING:  YOUR NAME  DATE OF BIRTH  CALL BACK NUMBER  REASON FOR CALL**this is important as we prioritize the call backs  YOU WILL RECEIVE A CALL BACK THE SAME DAY AS LONG AS YOU CALL BEFORE 4:00 PM  At the [facility], you and your health needs are our priority. As part of our continuing mission to provide you with exceptional heart care, we have created designated Provider Care Teams. These Care Teams include your primary Cardiologist (physician) and Advanced Practice Providers (APPs- Physician Assistants and Nurse Practitioners) who all work together to provide you with the care you need, when you need it.   You may see any of the following providers on your designated Care Team at your next follow up:  Dr Arvilla Meres  Dr Carron Curie, NP  Robbie Lis, [place]  Karle Plumber, PharmD   Please be sure to bring in all your medications bottles to every appointment.

## 2020-01-14 NOTE — Progress Notes (Signed)
Echocardiogram 2D Echocardiogram has been performed.  Warren Lacy Edyth Glomb 01/14/2020, 2:57 PM

## 2020-01-15 NOTE — Progress Notes (Signed)
Cardiology: Dr. Aundra Dubin PCP: Default, Provider, MD  39 y.o. with history of ETOH abuse and smoking, nonobstructive CAD, and primarily nonischemic cardiomyopathy presents for followup of CHF.  Patient has a strong family history of CAD, both his mother and father had CAD diagnosed in their 50s.  He moved to [place] about a year ago from [place] for his job.  Initially, he was here without his family and started drinking quite heavily. He also has smoked chronically.   He was admitted in 12/20 with pleuritic chest pain and abdominal pain.  Amylase and lipase were high suggesting acute pancreatitis, due to ETOH but also with possible contribution from very high triglycerides (4000 range initially).  Echo was done, showing EF 25%. Patient was initially admitted to [facility], but at this point was transferred to [facility].  CMRI showed EF 10% with no LGE.  Cath showed low filling pressures and age-advanced CAD but no tight blockages. No arrhythmias were noted while in the hospital.  He was started slowly on cardiac meds (due to soft BP) and discharged home.   He was re-admitted in 2/21 with sudden onset pleuritic chest pain concerning for acute pericarditis.  Echo showed pericardial tamponade and he had pericardiocentesis.  Echo after pericardial drain was removed showed EF 20-25%, global hypokinesis, normal RV.  He was started on high dose ASA and colchicine.    Repeat echo in 3/21 showed EF up to 30-35%, normal RV, no pericardial effusion.  Echo was done today and reviewed, EF 60-65%, RV nromal.    He is doing very well now.  No exertional dyspnea.  He is very active, playing some golf and doing projects around the house.  No chest pain. No orthopnea/PND.  No ETOH use.   Labs (12/20): K 4.1, creatinine 0.9, LDL 260, TGs 625, HDL 14 Labs (2/21): ANA negative, K 4.3, creatinine 0.72 => 0.87, LDL 48, HDL 29, TGs 112, ESR 70, CRP 28 => 23, digoxin 0.3 Labs (6/21): K 3.6, creatinine 0.86  PMH: 1. ETOH abuse 2. Smoking 3.  H/o ETOH pancreatitis in 12/20. 4. 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  5. Chronic systolic CHF: Primarily nonischemic cardiomyopathy.  Possibly related to ETOH abuse.  - Echo (12/20): Severely dilated LV with EF 25-30%.  - Cardiac MRI (12/20): EF 10%, moderate LV dilation, mild-moderately decreased RV systolic function, no late gadolinium enhancement.  - RHC/LHC (12/20): mean RA 1, PA 14/2, mean PCWP 2, CI 2.2.  Distal LAD narrows without severe discrete stenosis, 40% ostial D1, diffuse luminal irregularities.   - Echo (2/21): EF 20-25%, global HK, RV normal.  - Echo (3/21): EF 30-35%, global HK, normal RV, no pericardial effusion.  - Echo (6/21): EF 60-65%, normal RV 6. CAD: Coronary angiography in 12/20 showed that distal LAD narrows without severe discrete stenosis, 40% ostial D1, diffuse luminal irregularities.  7. Acute pericarditis with pericardial tamponade: 2/21, had pericardiocentesis.   SH: Married, works for a [employer], originally from [place], history of ETOH abuse and smoking.   FH: Father with CAD at age 58, mother with stents in her 46s.   ROS: All systems reviewed and negative except as per HPI.   Current Outpatient Medications  Medication Sig Dispense Refill   carvedilol (COREG) 12.5 MG tablet Take 1 tablet (12.5 mg total) by mouth 2 (two) times daily. 60 tablet 4   [employer]S ASPIRIN ADULT LOW DOSE 81 MG chewable tablet SMARTSIG:1 Tablet(s) By Mouth Daily     fenofibrate 160 MG tablet Take 80 mg by mouth  daily.     folic acid (FOLVITE) 397 MCG tablet Take 800 mcg by mouth daily.      Multiple Vitamin (MULTIVITAMIN WITH MINERALS) TABS tablet Take 1 tablet by mouth daily.     omega-3 acid ethyl esters (LOVAZA) 1 g capsule Take 2 capsules (2 g total) by mouth 2 (two) times daily. 120 capsule 3   pantoprazole (PROTONIX) 40 MG tablet TAKE 1 TABLET BY MOUTH EVERY DAY 90 tablet 3   rosuvastatin (CRESTOR) 40 MG tablet Take 1 tablet (40 mg total) by mouth daily at 6 PM. 30  tablet 2   sacubitril-valsartan (ENTRESTO) 97-103 MG Take 1 tablet by mouth 2 (two) times daily. 60 tablet 5   spironolactone (ALDACTONE) 25 MG tablet Take 1 tablet (25 mg total) by mouth at bedtime. 30 tablet 6   thiamine 250 MG tablet Take 125 mg by mouth daily.     No current facility-administered medications for this encounter.   BP 140/85    Pulse 73    Wt 77 kg (169 lb 12.8 oz)    SpO2 98%    BMI 24.36 kg/m  General: NAD Neck: No JVD, no thyromegaly or thyroid nodule.  Lungs: Clear to auscultation bilaterally with normal respiratory effort. CV: Nondisplaced PMI.  Heart regular S1/S2, no S3/S4, no murmur.  No peripheral edema.  No carotid bruit.  Normal pedal pulses.  Abdomen: Soft, nontender, no hepatosplenomegaly, no distention.  Skin: Intact without lesions or rashes.  Neurologic: Alert and oriented x 3.  Psych: Normal affect. Extremities: No clubbing or cyanosis.  HEENT: Normal.   Assessment/Plan: 1. Chronic systolic CHF: Echo at [facility] in 12/20 with EF 25-30%, normal RV. Cardiolite showed EF 15% with fixed defect. LHC/RHC 12/20 showed nonobstructive coronary disease and low filling pressures, CI 2.2. Had pleuritic chest pain possibly consistent with perimyocarditis however hs troponin not elevated and cRMI in 12/20 w/o evidence of myocarditis or infiltrative disease. Cardiomyopathy may also be related to heavy ETOH use.  Episode of acute pericarditis in 2/21 concerns me that the initial lesion may have been perimyocarditis.  NYHA class I symptoms now.  Not volume overloaded on exam.  Echo today showed EF up to 60-65%, significant improvement.  - Continue Entresto 97/103 bid.  BMET today.  - He does not need a diuretic.  - He can stop digoxin with recovery of EF.  - Continue spironolactone 25 daily.   - Continue Coreg 12.5 mg bid.   - Now out of ICD range.  2. 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 FH of CAD, markedly high lipids, smoker.  - Will need ASA 81  long-term.  - Started on Crestor 40 mg daily. Good lipids in 2/21.   3. Hyperlipidemia: LDL 260, HDL 14, TGs 625 (but 4792 when checked at [facility]). 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  - Started on Crestor 40 mg daily, LDL much better in 2/21.  - Started Lovaza (unable to afford Vascepa) and fenofibrate. TGs much improved on lipids in 2/21.  - He is being followed in lipid clinic.   4. Acute pancreatitis: ?Due to ETOH vs elevated triglycerides vs both. Resolved.   5 Smoking: He has quit.  6. ETOH abuse:  He has quit, imperative to stay off ETOH as I suspect that it was a major contributor to his cardiomyopathy.  7. Acute pericarditis: May have had pericarditis in 12/20, then with acute pericarditis in 2/21.  Chest pain has resolved.  No pericardial effusion on 3/21 echo.  - He has completed colchicine.  Followup in 6 months.    Loralie Champagne 01/15/2020

## 2020-02-25 ENCOUNTER — Other Ambulatory Visit (HOSPITAL_COMMUNITY): Payer: Self-pay | Admitting: Cardiology

## 2020-03-30 ENCOUNTER — Other Ambulatory Visit: Payer: Self-pay

## 2020-03-30 ENCOUNTER — Encounter (HOSPITAL_COMMUNITY): Payer: Self-pay | Admitting: Emergency Medicine

## 2020-03-30 ENCOUNTER — Emergency Department (HOSPITAL_COMMUNITY)
Admission: EM | Admit: 2020-03-30 | Discharge: 2020-03-31 | Disposition: A | Discharge status: Home / Self Care | Payer: Commercial Managed Care - PPO | Attending: Emergency Medicine | Admitting: Emergency Medicine

## 2020-03-30 DIAGNOSIS — Z87891 Personal history of nicotine dependence: Secondary | ICD-10-CM | POA: Insufficient documentation

## 2020-03-30 DIAGNOSIS — F1090 Alcohol use, unspecified, uncomplicated: Secondary | ICD-10-CM

## 2020-03-30 DIAGNOSIS — R112 Nausea with vomiting, unspecified: Secondary | ICD-10-CM | POA: Diagnosis not present

## 2020-03-30 DIAGNOSIS — R1011 Right upper quadrant pain: Secondary | ICD-10-CM | POA: Diagnosis not present

## 2020-03-30 DIAGNOSIS — Z7982 Long term (current) use of aspirin: Secondary | ICD-10-CM | POA: Insufficient documentation

## 2020-03-30 DIAGNOSIS — I252 Old myocardial infarction: Secondary | ICD-10-CM | POA: Diagnosis not present

## 2020-03-30 DIAGNOSIS — F1099 Alcohol use, unspecified with unspecified alcohol-induced disorder: Secondary | ICD-10-CM | POA: Insufficient documentation

## 2020-03-30 DIAGNOSIS — R7989 Other specified abnormal findings of blood chemistry: Secondary | ICD-10-CM

## 2020-03-30 DIAGNOSIS — R945 Abnormal results of liver function studies: Secondary | ICD-10-CM | POA: Diagnosis not present

## 2020-03-30 DIAGNOSIS — R109 Unspecified abdominal pain: Secondary | ICD-10-CM | POA: Diagnosis present

## 2020-03-30 LAB — COMPREHENSIVE METABOLIC PANEL
ALT: 133 U/L — ABNORMAL HIGH (ref 0–44)
AST: 287 U/L — ABNORMAL HIGH (ref 15–41)
Albumin: 3.6 g/dL (ref 3.5–5.0)
Alkaline Phosphatase: 102 U/L (ref 38–126)
Anion gap: 14 (ref 5–15)
BUN: <5 mg/dL — ABNORMAL LOW (ref 6–20)
CO2: 17 mmol/L — ABNORMAL LOW (ref 22–32)
Calcium: 8.3 mg/dL — ABNORMAL LOW (ref 8.9–10.3)
Chloride: 97 mmol/L — ABNORMAL LOW (ref 98–111)
Creatinine, Ser: 0.86 mg/dL (ref 0.61–1.24)
GFR calc Af Amer: >60 mL/min (ref >60)
GFR calc non Af Amer: >60 mL/min (ref >60)
Glucose, Bld: 114 mg/dL — ABNORMAL HIGH (ref 70–99)
Potassium: 3.7 mmol/L (ref 3.5–5.1)
Sodium: 128 mmol/L — ABNORMAL LOW (ref 135–145)
Total Bilirubin: 3.9 mg/dL — ABNORMAL HIGH (ref 0.3–1.2)
Total Protein: 6.2 g/dL — ABNORMAL LOW (ref 6.5–8.1)

## 2020-03-30 LAB — URINALYSIS, ROUTINE W REFLEX MICROSCOPIC
Bacteria, UA: NONE SEEN
Glucose, UA: NEGATIVE mg/dL
Hgb urine dipstick: NEGATIVE
Ketones, ur: 5 mg/dL — AB
Leukocytes,Ua: NEGATIVE
Nitrite: NEGATIVE
Protein, ur: 30 mg/dL — AB
Specific Gravity, Urine: 1.019 (ref 1.005–1.030)
pH: 5 (ref 5.0–8.0)

## 2020-03-30 LAB — CBC
HCT: 40 % (ref 39.0–52.0)
Hemoglobin: 14.6 g/dL (ref 13.0–17.0)
MCH: 34.3 pg — ABNORMAL HIGH (ref 26.0–34.0)
MCHC: 36.5 g/dL — ABNORMAL HIGH (ref 30.0–36.0)
MCV: 93.9 fL (ref 80.0–100.0)
Platelets: 171 10*3/uL (ref 150–400)
RBC: 4.26 MIL/uL (ref 4.22–5.81)
RDW: 12.3 % (ref 11.5–15.5)
WBC: 9.2 10*3/uL (ref 4.0–10.5)
nRBC: 0 % (ref 0.0–0.2)

## 2020-03-30 LAB — LIPASE, BLOOD: Lipase: 184 U/L — ABNORMAL HIGH (ref 11–51)

## 2020-03-30 NOTE — ED Notes (Signed)
Per lab, sample for CMP being sent to [facility] for processing.

## 2020-03-30 NOTE — ED Triage Notes (Signed)
Patient arrives to ED with complaints of lower abdominal pain that worsens when he eats. Pt states that the abdominal pain started yesterday and radiates to his upper abdomen. Pt states recently his eyes started to turn yellow. Pt has hx of alcoholic cardiomyopathy and pancreatitis.

## 2020-03-31 ENCOUNTER — Emergency Department (HOSPITAL_COMMUNITY): Payer: Commercial Managed Care - PPO

## 2020-03-31 LAB — HEPATITIS PANEL, ACUTE
HCV Ab: NONREACTIVE
Hep A IgM: NONREACTIVE
Hep B C IgM: NONREACTIVE
Hepatitis B Surface Ag: NONREACTIVE

## 2020-03-31 MED ORDER — SODIUM CHLORIDE 0.9 % IV BOLUS
250.0000 mL | Freq: Once | INTRAVENOUS | Status: AC
  Administered 2020-03-31: 250 mL via INTRAVENOUS

## 2020-03-31 MED ORDER — LORAZEPAM 2 MG/ML IJ SOLN
1.0000 mg | Freq: Once | INTRAMUSCULAR | Status: AC
  Administered 2020-03-31: 1 mg via INTRAVENOUS
  Filled 2020-03-31: qty 1

## 2020-03-31 MED ORDER — ONDANSETRON HCL 4 MG/2ML IJ SOLN
4.0000 mg | Freq: Once | INTRAMUSCULAR | Status: DC
  Filled 2020-03-31: qty 2

## 2020-03-31 MED ORDER — OMEPRAZOLE 40 MG PO CPDR: 40.0000 mg | DELAYED_RELEASE_CAPSULE | Freq: Every day | ORAL | 0 refills | Status: DC

## 2020-03-31 MED ORDER — MORPHINE SULFATE (PF) 4 MG/ML IV SOLN
4.0000 mg | Freq: Once | INTRAVENOUS | Status: AC
  Administered 2020-03-31: 4 mg via INTRAVENOUS
  Filled 2020-03-31: qty 1

## 2020-03-31 MED ORDER — SODIUM CHLORIDE 0.9 % IV BOLUS: 1000.0000 mL | Freq: Once | INTRAVENOUS | Status: DC

## 2020-03-31 MED ORDER — ONDANSETRON 4 MG PO TBDP: 4.0000 mg | ORAL_TABLET | Freq: Three times a day (TID) | ORAL | 0 refills | Status: DC | PRN

## 2020-03-31 NOTE — ED Provider Notes (Signed)
[facility] EMERGENCY DEPARTMENT Provider Note   CSN: [id number] Arrival date & time: 03/30/20  1642     History Chief Complaint  Patient presents with  . Abdominal Pain  . Jaundice    Richard Valenzuela is a 39 y.o. 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  Located all across his upper abdomen.  This is mild constantly but severe and worsening soon after eating or drinking anything.  Associated with nausea and vomiting, darker tea colored urine.  States his wife told him his eyes looked yellow.  No hematemesis, no coffee-ground emesis.  He went to urgent care who told him to come to the ER.  No interventions.  Denies associated fever, diarrhea or constipation.  No urinary symptoms.  No associated chest pain or shortness of breath.  No abdominal distention.  No extremity swelling.  Patient continues to drink alcohol.  States he used to drink more but now binges occasionally.  He drank all of Friday and Saturday.  Has been compliant with all his medicines.  HPI     Past Medical History:  Diagnosis Date  . Anginal pain (HCC)   . Hx of myocardial infarction     Patient Active Problem List   Diagnosis Date Noted  . Cardiac/pericardial tamponade 08/30/2019  . Cardiac tamponade 08/30/2019  . Hyperlipidemia 07/31/2019  . Systolic dysfunction 07/20/2019  . Alcoholic cardiomyopathy (HCC) 22/63/3354  . NSTEMI (non-ST elevated myocardial infarction) (HCC) 07/20/2019  . Tobacco abuse 07/20/2019  . Pancreatitis, acute 07/20/2019  . Hypertriglyceridemia 07/20/2019    Past Surgical History:  Procedure Laterality Date  . PERICARDIOCENTESIS N/A 08/30/2019   Procedure: PERICARDIOCENTESIS;  Surgeon: Tonny Bollman, MD;  Location: [facility] INVASIVE CV  LAB;  Service: Cardiovascular;  Laterality: N/A;  . RIGHT/LEFT HEART CATH AND CORONARY ANGIOGRAPHY N/A 07/21/2019   Procedure: RIGHT/LEFT HEART CATH AND CORONARY ANGIOGRAPHY;  Surgeon: Laurey Morale, MD;  Location: [facility] INVASIVE CV LAB;  Service: Cardiovascular;  Laterality: N/A;       Family History  Problem Relation Age of Onset  . Coronary artery disease Mother 64       Multiple coronary stents  . Coronary artery disease Father 74       Quadruple bypass    Social History   Tobacco Use  . Smoking status: Former Smoker    Types: Cigarettes  . Smokeless tobacco: Never Used  Substance Use Topics  . Alcohol use: Yes    Comment: 1 pint of liquor daily  . Drug use: Not on file    Home Medications Prior to Admission medications   Medication Sig Start Date End Date Taking? Authorizing Provider  carvedilol (COREG) 12.5 MG tablet TAKE 1 TABLET BY MOUTH 2 TIMES DAILY. Patient taking differently: Take 12.5 mg by mouth 2 (two) times daily with a meal.  02/25/20  Yes Laurey Morale, MD  [facility] ASPIRIN ADULT LOW DOSE 81 MG chewable tablet Chew 81 mg by mouth daily.  10/19/19  Yes [provider]  fenofibrate 160 MG tablet Take 80 mg by mouth daily. 10/18/19  Yes [provider]  folic acid (FOLVITE) 800 MCG tablet Take 800 mcg by mouth daily.    Yes [provider]  Multiple Vitamin (MULTIVITAMIN WITH MINERALS) TABS tablet Take 1 tablet by mouth  daily. 07/21/19  Yes Alwyn Ren, MD  omega-3 acid ethyl esters (LOVAZA) 1 g capsule Take 2 capsules (2 g total) by mouth 2 (two) times daily. 09/18/19  Yes Bensimhon, Bevelyn Buckles, MD  pantoprazole (PROTONIX) 40 MG tablet TAKE 1 TABLET BY MOUTH EVERY DAY Patient taking differently: Take 40 mg by mouth daily.  01/04/20  Yes Laurey Morale, MD  rosuvastatin (CRESTOR) 40 MG tablet Take 1 tablet (40 mg total) by mouth daily at 6 PM. 07/23/19  Yes Hall, Carole N, DO  sacubitril-valsartan (ENTRESTO) 97-103 MG Take 1 tablet by  mouth 2 (two) times daily. 10/26/19  Yes Laurey Morale, MD  spironolactone (ALDACTONE) 25 MG tablet Take 1 tablet (25 mg total) by mouth at bedtime. 07/27/19  Yes Laurey Morale, MD  thiamine 250 MG tablet Take 125 mg by mouth daily.   Yes [provider]  ondansetron (ZOFRAN ODT) 4 MG disintegrating tablet Take 1 tablet (4 mg total) by mouth every 8 (eight) hours as needed for nausea or vomiting. 03/31/20   Liberty Handy, PA-C  omeprazole (PRILOSEC) 40 MG capsule Take 1 capsule (40 mg total) by mouth daily. 03/31/20 03/31/20  Liberty Handy, PA-C    Allergies    Patient has no known allergies.  Review of Systems   Review of Systems  Gastrointestinal: Positive for abdominal pain, nausea and vomiting.  All other systems reviewed and are negative.   Physical Exam Updated Vital Signs BP 115/80 (BP Location: Left Arm)   Pulse (!) 104   Temp 98.5 F (36.9 C) (Oral)   Resp 17   Ht 5\' 10"  (1.778 m)   Wt 72.6 kg   SpO2 100%   BMI 22.96 kg/m   Physical Exam Vitals and nursing note reviewed.  Constitutional:      Appearance: He is well-developed.     Comments: Non toxic.  HENT:     Head: Normocephalic and atraumatic.     Nose: Nose normal.  Eyes:     Conjunctiva/sclera: Conjunctivae normal.     Comments: No significant scleral icterus  Cardiovascular:     Rate and Rhythm: Normal rate and regular rhythm.     Heart sounds: Normal heart sounds.  Pulmonary:     Effort: Pulmonary effort is normal.     Breath sounds: Normal breath sounds.  Abdominal:     General: Bowel sounds are normal.     Palpations: Abdomen is soft.     Tenderness: There is abdominal tenderness (Diffuse upper abdominal tenderness, worse in the epigastrium and right upper quadrant.  Mild.).     Comments: No G/R/R. No suprapubic or CVA tenderness. Negative Murphy's and McBurney's.  No distention.  No ascites.  Musculoskeletal:        General: Normal range of motion.     Cervical back: Normal range  of motion.  Skin:    General: Skin is warm and dry.     Capillary Refill: Capillary refill takes less than 2 seconds.  Neurological:     Mental Status: He is alert.  Psychiatric:        Behavior: Behavior normal.     ED Results / Procedures / Treatments   Labs (all labs ordered are listed, but only abnormal results are displayed) Labs Reviewed  LIPASE, BLOOD - Abnormal; Notable for the following components:      Result Value   Lipase 184 (*)    All other components within normal limits  COMPREHENSIVE METABOLIC PANEL -  Abnormal; Notable for the following components:   Sodium 128 (*)    Chloride 97 (*)    CO2 17 (*)    Glucose, Bld 114 (*)    BUN <5 (*)    Calcium 8.3 (*)    Total Protein 6.2 (*)    AST 287 (*)    ALT 133 (*)    Total Bilirubin 3.9 (*)    All other components within normal limits  CBC - Abnormal; Notable for the following components:   MCH 34.3 (*)    MCHC 36.5 (*)    All other components within normal limits  URINALYSIS, ROUTINE W REFLEX MICROSCOPIC - Abnormal; Notable for the following components:   Color, Urine AMBER (*)    Bilirubin Urine SMALL (*)    Ketones, ur 5 (*)    Protein, ur 30 (*)    All other components within normal limits  HEPATITIS PANEL, ACUTE    EKG None  Radiology US Abdomen Limited RUQ  Result Date: 03/31/2020 CLINICAL DATA:  Right upper quadrant pain. EXAM: ULTRASOUND ABDOMEN LIMITED RIGHT UPPER QUADRANT COMPARISON:  CT chest 11/09/2019. FINDINGS: Gallbladder: No gallstones or wall thickening visualized. No sonographic Murphy sign noted by sonographer. Common bile duct: Diameter: 3.1 mm Liver: Heterogenous dense parenchymal pattern suggesting fatty infiltration or hepatocellular disease. Echotexture of the liver makes evaluation for focal hepatic abnormalities difficult. No definite focal hepatic abnormality identified. Portal vein is patent on color Doppler imaging with normal direction of blood flow towards the liver. Other:  None. IMPRESSION: 1.  No gallstones or biliary distention. 2. Heterogeneous dense hepatic parenchymal pattern suggesting fatty infiltration or hepatocellular disease. Electronically Signed   By: Maisie Fus  Register   On: 03/31/2020 07:50    Procedures Procedures (including critical care time)  Medications Ordered in ED Medications  morphine 4 MG/ML injection 4 mg (4 mg Intravenous Given 03/31/20 0816)  sodium chloride 0.9 % bolus 250 mL (0 mLs Intravenous Stopped 03/31/20 1049)  LORazepam (ATIVAN) injection 1 mg (1 mg Intravenous Given 03/31/20 0816)    ED Course  I have reviewed the triage vital signs and the nursing notes.  Pertinent labs & imaging results that were available during my care of the patient were reviewed by me and considered in my medical decision making (see chart for details).  Clinical Course as of Mar 31 1054  Thu Mar 31, 2020  0710 Sodium(!): 128 [CG]  0710 Glucose(!): 114 [CG]  0710 CO2(!): 17 [CG]  0710 AST(!): 287 [CG]  0710 ALT(!): 133 [CG]  0710 Total Bilirubin(!): 3.9 [CG]  0710 Bilirubin Urine(!): SMALL [CG]  0710 Lipase(!): 184 [CG]  0710 WBC: 9.2 [CG]  0759 IMPRESSION: 1. No gallstones or biliary distention.  2. Heterogeneous dense hepatic parenchymal pattern suggesting fatty infiltration or hepatocellular disease.    US Abdomen Limited RUQ [CG]  0759 Creatinine: 0.86 [CG]    Clinical Course User Index [CG] Liberty Handy, PA-C   MDM Rules/Calculators/A&P                           39 y.o. yo with post prandrial abdominal pain.  History of heavy alcohol use.   I obtained additional history from triage, nursing notes and review of medical chart.  Previous medical records available, nursing notes reviewed to obtain more history and assist with MDM.    Significant cardiac history including alcoholic cardiomyopathy with CMRI EF 10%.  History of pericardial effusion, tamponade s/p pericardiocentesis.  History of pancreatitis.   Chief complain  involves an extensive number of treatment options and is a complaint that carries with it a high risk of complications and morbidity and mortality.    Differential diagnosis includes viral gastroenteritis vs GB process vs acute hepatitis vs pancreatitis.    No history of HIV, IVDU.    Initial ER work up including laboratory studies and imaging ordered in triage by RN.  I ordered additional laboratory and imaging studies including RUQ [place].   I have personally visualized and interpreted ER diagnostic work up including labs and imaging.    ER work up reveals elevated AST, ALT, total bilirubin as above.  Small bilirubin in urine. Hyponatremia. Normal creatinine. RUQ [place] consistent with fatty liver vs other hepatocellular disease.    I ordered medications including morphine, IVF. He is tachycardic likely from dehydration, pain or mild alcohol withdrawal.  Ativan ordered.   I have ordered continuous cardiac monitoring, pulse ox.  Will plan for serial re-examinations. Close monitoring.   I have re-evaluated the patient.   Reports significant improvement in symptoms. Tachycardic improved. No vomiting. Repeat abdominal exam much improved.   Overall, clinical presentation and ER work up most suggestive elevated LFT/lipase likely due to alcohol binging/abuse.  No fever, continued RUQ tenderness.  RUQ [place] negative.  Doubt acute cholecystitis.  Will send out hepatitis panel.    Discharge with zofran. Clear liquid to gentle diet as tolerated.  Strongly encouraged alcohol cessation, avoid tylenol.  Already on PPI. Close follow up with GI.  Strict return precautions given.  Discussed with EDP.   Final Clinical Impression(s) / ED Diagnoses Final diagnoses:  RUQ abdominal pain  Hyperbilirubinemia  Elevated LFTs  Heavy alcohol use    Rx / DC Orders ED Discharge Orders         Ordered    omeprazole (PRILOSEC) 40 MG capsule  Daily,   Status:  Discontinued        03/31/20 0942    ondansetron (ZOFRAN ODT) 4  MG disintegrating tablet  Every 8 hours PRN        03/31/20 0942           Liberty Handy, PA-C 03/31/20 1055    Raeford Razor, MD 04/02/20 [phone]

## 2020-03-31 NOTE — Discharge Instructions (Addendum)
You were seen in the ER for abdominal pain  Liver, pancreas lab values were elevated.  We have sent acute hepatitis panel  We think your symptoms and lab abnormalities are from alcohol use  Stop drinking alcohol, this will cause worsening heart and liver disease. Avoid acetaminophen (tylenol) containing medicines.   Use zofran for nausea as needed. Take your prescribed pantoprazole every morning on an empty stomach   Gentle low fat diet, small meals every 2-3 hours to stay hydrated  Call gastroenterology as soon as possible for follow up in the office  Return to ER for worsening symptoms, fever, nausea, vomiting, abdominal swelling or distention, worsening yellowing or skin or eyes, chest pain or shortness of breath

## 2020-04-09 ENCOUNTER — Other Ambulatory Visit (HOSPITAL_COMMUNITY): Payer: Self-pay | Admitting: Internal Medicine

## 2020-05-10 ENCOUNTER — Ambulatory Visit: Payer: Commercial Managed Care - PPO | Admitting: Physician Assistant

## 2020-05-12 ENCOUNTER — Other Ambulatory Visit (HOSPITAL_COMMUNITY): Payer: Self-pay | Admitting: Cardiology

## 2020-06-29 ENCOUNTER — Other Ambulatory Visit (HOSPITAL_COMMUNITY): Payer: Self-pay | Admitting: Cardiology

## 2020-07-16 ENCOUNTER — Other Ambulatory Visit (HOSPITAL_COMMUNITY): Payer: Self-pay | Admitting: Cardiology

## 2020-08-13 ENCOUNTER — Other Ambulatory Visit (HOSPITAL_COMMUNITY): Payer: Self-pay | Admitting: Cardiology

## 2020-08-15 MED ORDER — SPIRONOLACTONE 25 MG PO TABS
25.0000 mg | ORAL_TABLET | Freq: Every day | ORAL | 0 refills | Status: DC
Start: 2020-08-15 — End: 2020-09-09

## 2020-08-15 NOTE — Addendum Note (Signed)
Addended byRob Bunting R on: 08/15/2020 03:05 PM   Modules accepted: Orders

## 2020-08-16 ENCOUNTER — Ambulatory Visit (HOSPITAL_COMMUNITY)
Admission: RE | Admit: 2020-08-16 | Discharge: 2020-08-16 | Disposition: A | Discharge status: Home / Self Care | Payer: Commercial Managed Care - PPO | Source: Ambulatory Visit | Attending: Cardiology | Admitting: Cardiology

## 2020-08-16 ENCOUNTER — Other Ambulatory Visit: Payer: Self-pay

## 2020-08-16 ENCOUNTER — Encounter (HOSPITAL_COMMUNITY): Payer: Self-pay | Admitting: Cardiology

## 2020-08-16 VITALS — BP 118/70 | HR 86 | Wt 180.8 lb

## 2020-08-16 DIAGNOSIS — I519 Heart disease, unspecified: Secondary | ICD-10-CM

## 2020-08-16 DIAGNOSIS — I5022 Chronic systolic (congestive) heart failure: Secondary | ICD-10-CM | POA: Insufficient documentation

## 2020-08-16 DIAGNOSIS — Z7901 Long term (current) use of anticoagulants: Secondary | ICD-10-CM | POA: Diagnosis not present

## 2020-08-16 DIAGNOSIS — Z87891 Personal history of nicotine dependence: Secondary | ICD-10-CM | POA: Diagnosis not present

## 2020-08-16 DIAGNOSIS — I2510 Atherosclerotic heart disease of native coronary artery without angina pectoris: Secondary | ICD-10-CM | POA: Insufficient documentation

## 2020-08-16 DIAGNOSIS — Z7982 Long term (current) use of aspirin: Secondary | ICD-10-CM | POA: Diagnosis not present

## 2020-08-16 DIAGNOSIS — Z79899 Other long term (current) drug therapy: Secondary | ICD-10-CM | POA: Insufficient documentation

## 2020-08-16 DIAGNOSIS — E781 Pure hyperglyceridemia: Secondary | ICD-10-CM | POA: Insufficient documentation

## 2020-08-16 DIAGNOSIS — E785 Hyperlipidemia, unspecified: Secondary | ICD-10-CM | POA: Diagnosis not present

## 2020-08-16 DIAGNOSIS — Z8249 Family history of ischemic heart disease and other diseases of the circulatory system: Secondary | ICD-10-CM | POA: Diagnosis not present

## 2020-08-16 HISTORY — DX: Heart failure, unspecified: I50.9

## 2020-08-16 LAB — BASIC METABOLIC PANEL
Anion gap: 11 (ref 5–15)
BUN: 16 mg/dL (ref 6–20)
CO2: 22 mmol/L (ref 22–32)
Calcium: 9.7 mg/dL (ref 8.9–10.3)
Chloride: 106 mmol/L (ref 98–111)
Creatinine, Ser: 1.01 mg/dL (ref 0.61–1.24)
GFR, Estimated: >60 mL/min (ref >60)
Glucose, Bld: 97 mg/dL (ref 70–99)
Potassium: 3.9 mmol/L (ref 3.5–5.1)
Sodium: 139 mmol/L (ref 135–145)

## 2020-08-16 LAB — LIPID PANEL
Cholesterol: 273 mg/dL — ABNORMAL HIGH (ref 0–200)
HDL: 38 mg/dL — ABNORMAL LOW (ref >40)
LDL Cholesterol: 206 mg/dL — ABNORMAL HIGH (ref 0–99)
Total CHOL/HDL Ratio: 7.2 RATIO
Triglycerides: 147 mg/dL (ref <150)
VLDL: 29 mg/dL (ref 0–40)

## 2020-08-16 MED ORDER — ROSUVASTATIN CALCIUM 40 MG PO TABS
40.0000 mg | ORAL_TABLET | Freq: Every day | ORAL | 2 refills | Status: DC
Start: 2020-08-16 — End: 2020-08-16

## 2020-08-16 MED ORDER — ROSUVASTATIN CALCIUM 40 MG PO TABS
40.0000 mg | ORAL_TABLET | Freq: Every day | ORAL | 2 refills | Status: DC
Start: 2020-08-16 — End: 2021-06-14

## 2020-08-16 NOTE — Patient Instructions (Signed)
Lab work drawn today we will call you with any abnormal results  Your physician recommends that you return for lab work in: 3 months  Your physician has requested that you have an echocardiogram. Echocardiography is a painless test that uses sound waves to create images of your heart. It provides your doctor with information about the size and shape of your heart and how well your heart's chambers and valves are working. This procedure takes approximately one hour. There are no restrictions for this procedure.   Your physician recommends that you schedule a follow-up appointment in:  June 2022 with echocardiogram

## 2020-08-17 NOTE — Progress Notes (Signed)
Cardiology: Dr. Shirlee Latch PCP: Patient, No Pcp Per  40 y.o. with history of ETOH abuse and smoking, nonobstructive CAD, and primarily nonischemic cardiomyopathy presents for followup of CHF.  Patient has a strong family history of CAD, both his mother and father had CAD diagnosed in their 37s.  He moved to [place] about a year ago from [place] for his job.  Initially, he was here without his family and started drinking quite heavily. He also has smoked chronically.   He was admitted in 12/20 with pleuritic chest pain and abdominal pain.  Amylase and lipase were high suggesting acute pancreatitis, due to ETOH but also with possible contribution from very high triglycerides (4000 range initially).  Echo was done, showing EF 25%. Patient was initially admitted to [facility], but at this point was transferred to [facility].  CMRI showed EF 10% with no LGE.  Cath showed low filling pressures and age-advanced CAD but no tight blockages. No arrhythmias were noted while in the hospital.  He was started slowly on cardiac meds (due to soft BP) and discharged home.   He was re-admitted in 2/21 with sudden onset pleuritic chest pain concerning for acute pericarditis.  Echo showed pericardial tamponade and he had pericardiocentesis.  Echo after pericardial drain was removed showed EF 20-25%, global hypokinesis, normal RV.  He was started on high dose ASA and colchicine.    Repeat echo in 3/21 showed EF up to 30-35%, normal RV, no pericardial effusion.  Echo in 6/21 showed EF 60-65%, RV normal.   Patient seems to have had a relapse with ETOH use in the fall, but has quit again since Thanksgiving. No smoking.  He feels good, no exertional dyspnea.  No chest pain.  No orthopnea/PND.  He is working full time.  No lightheadedness.  He has been out of Crestor for a few days.   Labs (12/20): K 4.1, creatinine 0.9, LDL 260, TGs 625, HDL 14 Labs (2/21): ANA negative, K 4.3, creatinine 0.72 => 0.87, LDL 48, HDL 29, TGs 112, ESR 70, CRP 28 => 23,  digoxin 0.3 Labs (6/21): K 3.6, creatinine 0.86 Labs (12/21): K 4.6, creatinine 0.81  PMH: 1. ETOH abuse 2. Smoking 3. H/o ETOH pancreatitis in 12/20. 4. 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  5. Chronic systolic CHF: Primarily nonischemic cardiomyopathy.  Possibly related to ETOH abuse.  - Echo (12/20): Severely dilated LV with EF 25-30%.  - Cardiac MRI (12/20): EF 10%, moderate LV dilation, mild-moderately decreased RV systolic function, no late gadolinium enhancement.  - RHC/LHC (12/20): mean RA 1, PA 14/2, mean PCWP 2, CI 2.2.  Distal LAD narrows without severe discrete stenosis, 40% ostial D1, diffuse luminal irregularities.   - Echo (2/21): EF 20-25%, global HK, RV normal.  - Echo (3/21): EF 30-35%, global HK, normal RV, no pericardial effusion.  - Echo (6/21): EF 60-65%, normal RV 6. CAD: Coronary angiography in 12/20 showed that distal LAD narrows without severe discrete stenosis, 40% ostial D1, diffuse luminal irregularities.  7. Acute pericarditis with pericardial tamponade: 2/21, had pericardiocentesis.   SH: Married, works for a [employer], originally from [place], history of ETOH abuse and smoking.   FH: Father with CAD at age 69, mother with stents in her 40s.   ROS: All systems reviewed and negative except as per HPI.   Current Outpatient Medications  Medication Sig Dispense Refill  . carvedilol (COREG) 12.5 MG tablet TAKE 1 TABLET BY MOUTH 2 TIMES DAILY. 180 tablet 3  . CVS ASPIRIN ADULT LOW DOSE 81  MG chewable tablet Chew 81 mg by mouth daily.     Marland Kitchen ENTRESTO 97-103 MG TAKE 1 TABLET BY MOUTH TWICE A DAY 60 tablet 5  . fenofibrate 160 MG tablet Take 80 mg by mouth daily.    . folic acid (FOLVITE) 381 MCG tablet Take 800 mcg by mouth daily.     . Multiple Vitamin (MULTIVITAMIN WITH MINERALS) TABS tablet Take 1 tablet by mouth daily.    Marland Kitchen omega-3 acid ethyl esters (LOVAZA) 1 g capsule Take 2 capsules (2 g total) by mouth 2 (two) times daily. 360 capsule 3  . spironolactone  (ALDACTONE) 25 MG tablet Take 1 tablet (25 mg total) by mouth at bedtime. Pt needs appointment for future refills 30 tablet 0  . thiamine 250 MG tablet Take 125 mg by mouth daily.    . rosuvastatin (CRESTOR) 40 MG tablet Take 1 tablet (40 mg total) by mouth daily at 6 PM. 90 tablet 2   No current facility-administered medications for this encounter.   BP 118/70   Pulse 86   Wt 82 kg (180 lb 12.8 oz)   SpO2 97%   BMI 25.94 kg/m  General: NAD Neck: No JVD, no thyromegaly or thyroid nodule.  Lungs: Clear to auscultation bilaterally with normal respiratory effort. CV: Nondisplaced PMI.  Heart regular S1/S2, no S3/S4, no murmur.  No peripheral edema.  No carotid bruit.  Normal pedal pulses.  Abdomen: Soft, nontender, no hepatosplenomegaly, no distention.  Skin: Intact without lesions or rashes.  Neurologic: Alert and oriented x 3.  Psych: Normal affect. Extremities: No clubbing or cyanosis.  HEENT: Normal.   Assessment/Plan: 1. Chronic systolic CHF: Echo at [facility] in 12/20 with EF 25-30%, normal RV. Cardiolite showed EF 15% with fixed defect. LHC/RHC 12/20 showed nonobstructive coronary disease and low filling pressures, CI 2.2. Had pleuritic chest pain possibly consistent with perimyocarditis however hs troponin not elevated and cRMI in 12/20 w/o evidence of myocarditis or infiltrative disease. Cardiomyopathy may also be related to heavy ETOH use.  Episode of acute pericarditis in 2/21 concerns me that the initial lesion may have been perimyocarditis.  Echo in 6/21 with EF back up to 60-65% in setting of abstinence from ETOH.  NYHA class I symptoms now.  Not volume overloaded on exam.  - Continue Entresto 97/103 bid.  BMET today and every 3 months.  - He does not need a diuretic.  - Continue spironolactone 25 daily.   - Continue Coreg 12.5 mg bid.   - Now out of ICD range.  - Repeat echo in 6/22.  - Imperative that he stay off ETOH.  2. 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 FH  of CAD, markedly high lipids, smoker.  - Will need ASA 81 long-term.  - Continue Crestor 40 mg daily, check lipids.    3. Hyperlipidemia: LDL 260, HDL 14, TGs 625 (but 4792 when checked at [facility]). 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  - Continue Crestor.  - Continue fenofibrate.  4. Acute pancreatitis: ?Due to ETOH vs elevated triglycerides vs both. Resolved.   5 Smoking: He has quit.  6. ETOH abuse:  He has quit since Thanksgiving after a relapse in the fall, imperative to stay off ETOH as I suspect that it was a major contributor to his cardiomyopathy.  - Check LFTs today.  7. Acute pericarditis: May have had pericarditis in 12/20, then with acute pericarditis in 2/21.  Chest pain has resolved.  No pericardial effusion on 3/21 echo.  - He has completed colchicine.  Followup in 6/22 with echo.     Loralie Champagne 08/17/2020

## 2020-08-19 ENCOUNTER — Encounter (HOSPITAL_COMMUNITY): Payer: Self-pay

## 2020-08-19 NOTE — Progress Notes (Signed)
Records faxed to [facility] as requested.    Fax [phone]

## 2020-09-07 ENCOUNTER — Other Ambulatory Visit (HOSPITAL_COMMUNITY): Payer: Self-pay | Admitting: Cardiology

## 2020-09-09 ENCOUNTER — Other Ambulatory Visit (HOSPITAL_COMMUNITY): Payer: Self-pay | Admitting: Cardiology

## 2020-11-14 ENCOUNTER — Other Ambulatory Visit: Payer: Self-pay

## 2020-11-14 ENCOUNTER — Ambulatory Visit (HOSPITAL_COMMUNITY)
Admission: RE | Admit: 2020-11-14 | Discharge: 2020-11-14 | Disposition: A | Discharge status: Home / Self Care | Payer: Commercial Managed Care - PPO | Source: Ambulatory Visit | Attending: Cardiology | Admitting: Cardiology

## 2020-11-14 DIAGNOSIS — I519 Heart disease, unspecified: Secondary | ICD-10-CM | POA: Diagnosis not present

## 2020-11-14 LAB — LIPID PANEL
Cholesterol: 173 mg/dL (ref 0–200)
HDL: 43 mg/dL (ref >40)
LDL Cholesterol: UNDETERMINED mg/dL (ref 0–99)
Total CHOL/HDL Ratio: 4 RATIO
Triglycerides: 848 mg/dL — ABNORMAL HIGH (ref <150)
VLDL: UNDETERMINED mg/dL (ref 0–40)

## 2020-11-14 LAB — COMPREHENSIVE METABOLIC PANEL
ALT: 32 U/L (ref 0–44)
AST: 47 U/L — ABNORMAL HIGH (ref 15–41)
Albumin: 3.9 g/dL (ref 3.5–5.0)
Alkaline Phosphatase: 44 U/L (ref 38–126)
Anion gap: 8 (ref 5–15)
BUN: 13 mg/dL (ref 6–20)
CO2: 25 mmol/L (ref 22–32)
Calcium: 9.7 mg/dL (ref 8.9–10.3)
Chloride: 103 mmol/L (ref 98–111)
Creatinine, Ser: 0.83 mg/dL (ref 0.61–1.24)
GFR, Estimated: >60 mL/min (ref >60)
Glucose, Bld: 102 mg/dL — ABNORMAL HIGH (ref 70–99)
Potassium: 4 mmol/L (ref 3.5–5.1)
Sodium: 136 mmol/L (ref 135–145)
Total Bilirubin: 0.8 mg/dL (ref 0.3–1.2)
Total Protein: 6.8 g/dL (ref 6.5–8.1)

## 2020-11-14 LAB — LDL CHOLESTEROL, DIRECT: Direct LDL: 58.8 mg/dL (ref 0–99)

## 2020-11-17 ENCOUNTER — Telehealth (HOSPITAL_COMMUNITY): Payer: Self-pay

## 2020-11-17 MED ORDER — FENOFIBRATE 160 MG PO TABS: 160.0000 mg | ORAL_TABLET | Freq: Every day | ORAL | 3 refills | Status: DC

## 2020-11-17 NOTE — Telephone Encounter (Signed)
Patient advised and verbalized understanding. Patient reports that he has been drinking some but not much advised him to cut out ETOH completely. Med list updated to reflect changes.   Meds ordered this encounter  Medications  . fenofibrate 160 MG tablet    Sig: Take 1 tablet (160 mg total) by mouth daily.    Dispense:  90 tablet    Refill:  3

## 2020-11-17 NOTE — Telephone Encounter (Signed)
-----   Message from Laurey Morale, MD sent at 11/16/2020 12:02 PM EDT ----- Would have him increase fenofibrate to 160 mg daily.  Make sure he is not drinking ETOH and needs dietary changes. Lipids again in 2 months.  If still too high, will need referral to lipid clinic.

## 2020-12-06 ENCOUNTER — Other Ambulatory Visit (HOSPITAL_COMMUNITY): Payer: Self-pay | Admitting: *Deleted

## 2020-12-06 MED ORDER — FENOFIBRATE 160 MG PO TABS
160.0000 mg | ORAL_TABLET | Freq: Every day | ORAL | 3 refills | Status: DC
Start: 2020-12-06 — End: 2022-01-08

## 2020-12-26 ENCOUNTER — Other Ambulatory Visit: Payer: Self-pay

## 2020-12-26 ENCOUNTER — Other Ambulatory Visit (HOSPITAL_COMMUNITY): Payer: Self-pay | Admitting: *Deleted

## 2020-12-26 ENCOUNTER — Ambulatory Visit (HOSPITAL_COMMUNITY)
Admission: RE | Admit: 2020-12-26 | Discharge: 2020-12-26 | Disposition: A | Discharge status: Home / Self Care | Payer: Commercial Managed Care - PPO | Source: Ambulatory Visit | Attending: Cardiology | Admitting: Cardiology

## 2020-12-26 ENCOUNTER — Ambulatory Visit (HOSPITAL_BASED_OUTPATIENT_CLINIC_OR_DEPARTMENT_OTHER)
Admission: RE | Admit: 2020-12-26 | Discharge: 2020-12-26 | Disposition: A | Discharge status: Home / Self Care | Payer: Commercial Managed Care - PPO | Source: Ambulatory Visit | Attending: Cardiology | Admitting: Cardiology

## 2020-12-26 VITALS — BP 144/98 | HR 74 | Ht 70.0 in | Wt 186.4 lb

## 2020-12-26 DIAGNOSIS — I519 Heart disease, unspecified: Secondary | ICD-10-CM | POA: Diagnosis not present

## 2020-12-26 DIAGNOSIS — I429 Cardiomyopathy, unspecified: Secondary | ICD-10-CM | POA: Diagnosis not present

## 2020-12-26 DIAGNOSIS — E785 Hyperlipidemia, unspecified: Secondary | ICD-10-CM

## 2020-12-26 DIAGNOSIS — I5022 Chronic systolic (congestive) heart failure: Secondary | ICD-10-CM

## 2020-12-26 DIAGNOSIS — Z87891 Personal history of nicotine dependence: Secondary | ICD-10-CM | POA: Insufficient documentation

## 2020-12-26 DIAGNOSIS — I5042 Chronic combined systolic (congestive) and diastolic (congestive) heart failure: Secondary | ICD-10-CM | POA: Insufficient documentation

## 2020-12-26 LAB — COMPREHENSIVE METABOLIC PANEL
ALT: 26 U/L (ref 0–44)
AST: 37 U/L (ref 15–41)
Albumin: 4.1 g/dL (ref 3.5–5.0)
Alkaline Phosphatase: 36 U/L — ABNORMAL LOW (ref 38–126)
Anion gap: 8 (ref 5–15)
BUN: 14 mg/dL (ref 6–20)
CO2: 23 mmol/L (ref 22–32)
Calcium: 9.3 mg/dL (ref 8.9–10.3)
Chloride: 107 mmol/L (ref 98–111)
Creatinine, Ser: 0.87 mg/dL (ref 0.61–1.24)
GFR, Estimated: >60 mL/min (ref >60)
Glucose, Bld: 95 mg/dL (ref 70–99)
Potassium: 3.5 mmol/L (ref 3.5–5.1)
Sodium: 138 mmol/L (ref 135–145)
Total Bilirubin: 0.7 mg/dL (ref 0.3–1.2)
Total Protein: 7 g/dL (ref 6.5–8.1)

## 2020-12-26 LAB — ECHOCARDIOGRAM COMPLETE
Area-P 1/2: 2.6 cm2
Calc EF: 41.7 %
S' Lateral: 4 cm
Single Plane A2C EF: 41.8 %
Single Plane A4C EF: 42.8 %

## 2020-12-26 LAB — LIPID PANEL
Cholesterol: 123 mg/dL (ref 0–200)
HDL: 46 mg/dL (ref >40)
LDL Cholesterol: 41 mg/dL (ref 0–99)
Total CHOL/HDL Ratio: 2.7 RATIO
Triglycerides: 178 mg/dL — ABNORMAL HIGH (ref <150)
VLDL: 36 mg/dL (ref 0–40)

## 2020-12-26 MED ORDER — CARVEDILOL 12.5 MG PO TABS: 18.7500 mg | ORAL_TABLET | Freq: Two times a day (BID) | ORAL | 3 refills | Status: DC

## 2020-12-26 NOTE — Progress Notes (Signed)
Cardiology: Dr. Shirlee Latch PCP: Patient, No Pcp Per (Inactive)  40 y.o. with history of ETOH abuse and smoking, nonobstructive CAD, and primarily nonischemic cardiomyopathy presents for followup of CHF.  Patient has a strong family history of CAD, both his mother and father had CAD diagnosed in their 75s.  He moved to [place] about a year ago from [place] for his job.  Initially, he was here without his family and started drinking quite heavily. He also has smoked chronically.   He was admitted in 12/20 with pleuritic chest pain and abdominal pain.  Amylase and lipase were high suggesting acute pancreatitis, due to ETOH but also with possible contribution from very high triglycerides (4000 range initially).  Echo was done, showing EF 25%. Patient was initially admitted to [facility], but at this point was transferred to [facility].  CMRI showed EF 10% with no LGE.  Cath showed low filling pressures and age-advanced CAD but no tight blockages. No arrhythmias were noted while in the hospital.  He was started slowly on cardiac meds (due to soft BP) and discharged home.   He was re-admitted in 2/21 with sudden onset pleuritic chest pain concerning for acute pericarditis.  Echo showed pericardial tamponade and he had pericardiocentesis.  Echo after pericardial drain was removed showed EF 20-25%, global hypokinesis, normal RV.  He was started on high dose ASA and colchicine.    Repeat echo in 3/21 showed EF up to 30-35%, normal RV, no pericardial effusion.  Echo in 6/21 showed EF 60-65%, RV normal.  Echo done today and reviewed showed EF 45-50%, diffuse mild hypokinesis, normal RV.    Weight is up 6 lbs. BP is elevated today, patient reports taking all his meds.  He has cut back again on ETOH, says he drinks no more than 1 beer/day.  No exertional dyspnea or chest pain. He is generally very active.  Does yardwork and jobs around the house.  Continues to work full time.  Overall feeling good.   Labs (12/20): K 4.1, creatinine 0.9, LDL  260, TGs 625, HDL 14 Labs (2/21): ANA negative, K 4.3, creatinine 0.72 => 0.87, LDL 48, HDL 29, TGs 112, ESR 70, CRP 28 => 23, digoxin 0.3 Labs (6/21): K 3.6, creatinine 0.86 Labs (12/21): K 4.6, creatinine 0.81 Labs (4/22): LDL 59, TGs 848, K 4, creatinine 0.83, AST 47, ALT 32  PMH: 1. ETOH abuse 2. Smoking 3. H/o ETOH pancreatitis in 12/20. 4. 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  5. Chronic systolic CHF: Primarily nonischemic cardiomyopathy.  Possibly related to ETOH abuse.  - Echo (12/20): Severely dilated LV with EF 25-30%.  - Cardiac MRI (12/20): EF 10%, moderate LV dilation, mild-moderately decreased RV systolic function, no late gadolinium enhancement.  - RHC/LHC (12/20): mean RA 1, PA 14/2, mean PCWP 2, CI 2.2.  Distal LAD narrows without severe discrete stenosis, 40% ostial D1, diffuse luminal irregularities.   - Echo (2/21): EF 20-25%, global HK, RV normal.  - Echo (3/21): EF 30-35%, global HK, normal RV, no pericardial effusion.  - Echo (6/21): EF 60-65%, normal RV - Echo (6/22): EF 45-50%, diffuse hypokinesis, normal RV.  6. CAD: Coronary angiography in 12/20 showed that distal LAD narrows without severe discrete stenosis, 40% ostial D1, diffuse luminal irregularities.  7. Acute pericarditis with pericardial tamponade: 2/21, had pericardiocentesis.   SH: Married, works for a [employer], originally from [place], history of ETOH abuse and smoking.   FH: Father with CAD at age 29, mother with stents in her 63s.  ROS: All systems reviewed and negative except as per HPI.   Current Outpatient Medications  Medication Sig Dispense Refill  . CVS ASPIRIN ADULT LOW DOSE 81 MG chewable tablet Chew 81 mg by mouth daily.     Marland Kitchen ENTRESTO 97-103 MG TAKE 1 TABLET BY MOUTH TWICE A DAY 60 tablet 5  . fenofibrate 160 MG tablet Take 1 tablet (160 mg total) by mouth daily. 90 tablet 3  . folic acid (FOLVITE) 578 MCG tablet Take 800 mcg by mouth daily.     . Multiple Vitamin (MULTIVITAMIN WITH  MINERALS) TABS tablet Take 1 tablet by mouth daily.    Marland Kitchen omega-3 acid ethyl esters (LOVAZA) 1 g capsule Take 2 capsules (2 g total) by mouth 2 (two) times daily. 360 capsule 3  . rosuvastatin (CRESTOR) 40 MG tablet Take 1 tablet (40 mg total) by mouth daily at 6 PM. 90 tablet 2  . spironolactone (ALDACTONE) 25 MG tablet Take 1 tablet (25 mg total) by mouth at bedtime. 60 tablet 1  . thiamine 250 MG tablet Take 125 mg by mouth daily.    . carvedilol (COREG) 12.5 MG tablet Take 1.5 tablets (18.75 mg total) by mouth 2 (two) times daily. 270 tablet 3   No current facility-administered medications for this encounter.   BP (!) 144/98   Pulse 74   Ht $R'5\' 10"'yq$  (1.778 m)   Wt 84.6 kg (186 lb 6.4 oz)   SpO2 99%   BMI 26.75 kg/m  General: NAD Neck: No JVD, no thyromegaly or thyroid nodule.  Lungs: Clear to auscultation bilaterally with normal respiratory effort. CV: Nondisplaced PMI.  Heart regular S1/S2, no S3/S4, no murmur.  No peripheral edema.  No carotid bruit.  Normal pedal pulses.  Abdomen: Soft, nontender, no hepatosplenomegaly, no distention.  Skin: Intact without lesions or rashes.  Neurologic: Alert and oriented x 3.  Psych: Normal affect. Extremities: No clubbing or cyanosis.  HEENT: Normal.   Assessment/Plan: 1. Chronic systolic CHF: Echo at [facility] in 12/20 with EF 25-30%, normal RV. Cardiolite showed EF 15% with fixed defect. LHC/RHC 12/20 showed nonobstructive coronary disease and low filling pressures, CI 2.2. Had pleuritic chest pain possibly consistent with perimyocarditis however hs troponin not elevated and cRMI in 12/20 w/o evidence of myocarditis or infiltrative disease. Cardiomyopathy may also be related to heavy ETOH use.  Episode of acute pericarditis in 2/21 also concerns me that the initial lesion may have been perimyocarditis.  Echo in 6/21 with EF back up to 60-65% in setting of abstinence from ETOH.  Echo in 6/22 with EF 45-50%, earlier this year was drinking more  ETOH but has cut back to no more than 1 drink/day.  NYHA class I symptoms now.  Not volume overloaded on exam.  - Continue Entresto 97/103 bid.  BMET today.  - He does not need a diuretic.  - Continue spironolactone 25 daily.   - Increase Coreg to 18.75 mg bid.   - Now out of ICD range.  - Imperative that he stay off ETOH.  2. 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 FH of CAD, markedly high lipids, smoker.  - Will need ASA 81 long-term.  - Continue Crestor 40 mg daily, check lipids.    3. Hyperlipidemia: Suspect familial dyslipidemia.  - Continue Crestor.  - Continue fenofibrate and Lovaza.  - Lipids today.  4. Acute pancreatitis: ?Due to ETOH vs elevated triglycerides vs both. Resolved.   5 Smoking: He has quit.  6. ETOH abuse:  Imperative to  stay off ETOH as I suspect that it was a major contributor to his cardiomyopathy.  7. Acute pericarditis: May have had pericarditis in 12/20, then with acute pericarditis in 2/21.  Chest pain has resolved.  No pericardial effusion on 3/21 echo.  - He has completed colchicine.  8. HTN: Increasing Coreg as above.   - Check BP daily on higher dose of Coreg, call in readings.   Followup in 3 months.     Loralie Champagne 12/26/2020

## 2020-12-26 NOTE — Patient Instructions (Signed)
Increase Carvedilol to 18.75 mg (1 & 1/2 tabs) Twice daily   Labs done today, we will call you for abnormal results  Your physician has requested that you regularly monitor and record your blood pressure readings at home. Please use the same machine at the same time of day to check your readings and record them, please send the readings to [place] in 1 week, you can send through MyChart or call them in  Your physician recommends that you schedule a follow-up appointment in: 3 months  If you have any questions or concerns before your next appointment please send [place] a message through [place] or call our office at [phone].    TO LEAVE A MESSAGE FOR THE NURSE SELECT OPTION 2, PLEASE LEAVE A MESSAGE INCLUDING: . YOUR NAME . DATE OF BIRTH . CALL BACK NUMBER . REASON FOR CALL**this is important as we prioritize the call backs  YOU WILL RECEIVE A CALL BACK THE SAME DAY AS LONG AS YOU CALL BEFORE 4:00 PM  At the [facility], you and your health needs are our priority. As part of our continuing mission to provide you with exceptional heart care, we have created designated Provider Care Teams. These Care Teams include your primary Cardiologist (physician) and Advanced Practice Providers (APPs- Physician Assistants and Nurse Practitioners) who all work together to provide you with the care you need, when you need it.   You may see any of the following providers on your designated Care Team at your next follow up: Marland Kitchen Dr Arvilla Meres . Dr Marca Ancona . Dr Thornell Mule . Tonye Becket, NP . Robbie Lis, [place] . Shanda Bumps [place],NP . Karle Plumber, PharmD   Please be sure to bring in all your medications bottles to every appointment.

## 2020-12-26 NOTE — Progress Notes (Signed)
  Echocardiogram 2D Echocardiogram has been performed.  Richard Valenzuela 12/26/2020, 2:37 PM

## 2021-01-12 ENCOUNTER — Other Ambulatory Visit (HOSPITAL_COMMUNITY): Payer: Self-pay | Admitting: Cardiology

## 2021-04-07 ENCOUNTER — Ambulatory Visit (HOSPITAL_COMMUNITY)
Admission: RE | Admit: 2021-04-07 | Discharge: 2021-04-07 | Disposition: A | Discharge status: Home / Self Care | Payer: Commercial Managed Care - PPO | Source: Ambulatory Visit | Attending: Cardiology | Admitting: Cardiology

## 2021-04-07 ENCOUNTER — Other Ambulatory Visit: Payer: Self-pay

## 2021-04-07 ENCOUNTER — Encounter (HOSPITAL_COMMUNITY): Payer: Self-pay | Admitting: Cardiology

## 2021-04-07 ENCOUNTER — Other Ambulatory Visit (HOSPITAL_COMMUNITY): Payer: Self-pay

## 2021-04-07 VITALS — BP 96/68 | HR 78 | Ht 70.0 in | Wt 180.2 lb

## 2021-04-07 DIAGNOSIS — I110 Hypertensive heart disease with heart failure: Secondary | ICD-10-CM | POA: Diagnosis not present

## 2021-04-07 DIAGNOSIS — Z8249 Family history of ischemic heart disease and other diseases of the circulatory system: Secondary | ICD-10-CM | POA: Diagnosis not present

## 2021-04-07 DIAGNOSIS — I2510 Atherosclerotic heart disease of native coronary artery without angina pectoris: Secondary | ICD-10-CM | POA: Diagnosis not present

## 2021-04-07 DIAGNOSIS — Z87891 Personal history of nicotine dependence: Secondary | ICD-10-CM | POA: Insufficient documentation

## 2021-04-07 DIAGNOSIS — Z7982 Long term (current) use of aspirin: Secondary | ICD-10-CM | POA: Diagnosis not present

## 2021-04-07 DIAGNOSIS — F1011 Alcohol abuse, in remission: Secondary | ICD-10-CM | POA: Insufficient documentation

## 2021-04-07 DIAGNOSIS — E785 Hyperlipidemia, unspecified: Secondary | ICD-10-CM | POA: Insufficient documentation

## 2021-04-07 DIAGNOSIS — I5022 Chronic systolic (congestive) heart failure: Secondary | ICD-10-CM | POA: Insufficient documentation

## 2021-04-07 DIAGNOSIS — Z7984 Long term (current) use of oral hypoglycemic drugs: Secondary | ICD-10-CM | POA: Insufficient documentation

## 2021-04-07 DIAGNOSIS — I519 Heart disease, unspecified: Secondary | ICD-10-CM | POA: Diagnosis not present

## 2021-04-07 DIAGNOSIS — Z79899 Other long term (current) drug therapy: Secondary | ICD-10-CM | POA: Insufficient documentation

## 2021-04-07 LAB — BASIC METABOLIC PANEL
Anion gap: 7 (ref 5–15)
BUN: 12 mg/dL (ref 6–20)
CO2: 24 mmol/L (ref 22–32)
Calcium: 9.6 mg/dL (ref 8.9–10.3)
Chloride: 109 mmol/L (ref 98–111)
Creatinine, Ser: 1.01 mg/dL (ref 0.61–1.24)
GFR, Estimated: >60 mL/min (ref >60)
Glucose, Bld: 89 mg/dL (ref 70–99)
Potassium: 3.9 mmol/L (ref 3.5–5.1)
Sodium: 140 mmol/L (ref 135–145)

## 2021-04-07 MED ORDER — DAPAGLIFLOZIN PROPANEDIOL 10 MG PO TABS: 10.0000 mg | ORAL_TABLET | Freq: Every day | ORAL | 6 refills | Status: DC

## 2021-04-07 NOTE — Patient Instructions (Addendum)
Start Farxiga 10 mg Daily  Labs done today, we will call you with abnormal results  Your physician recommends that you return for lab work in: 1-2 weeks, we have provided you a prescription to have this done locally  Your physician recommends that you schedule a follow-up appointment in: 4 months with an echocardiogram **PLEASE CALL OUR OFFICE IN December TO SCHEDULE THIS**  Do the following things EVERYDAY: Weigh yourself in the morning before breakfast. Write it down and keep it in a log. Take your medicines as prescribed Eat low salt foods--Limit salt (sodium) to 2000 mg per day.  Stay as active as you can everyday Limit all fluids for the day to less than 2 liters  If you have any questions or concerns before your next appointment please send [place] a message through [place] or call our office at [phone].    TO LEAVE A MESSAGE FOR THE NURSE SELECT OPTION 2, PLEASE LEAVE A MESSAGE INCLUDING: YOUR NAME DATE OF BIRTH CALL BACK NUMBER REASON FOR CALL**this is important as we prioritize the call backs  YOU WILL RECEIVE A CALL BACK THE SAME DAY AS LONG AS YOU CALL BEFORE 4:00 PM  At the [facility], you and your health needs are our priority. As part of our continuing mission to provide you with exceptional heart care, we have created designated Provider Care Teams. These Care Teams include your primary Cardiologist (physician) and Advanced Practice Providers (APPs- Physician Assistants and Nurse Practitioners) who all work together to provide you with the care you need, when you need it.   You may see any of the following providers on your designated Care Team at your next follow up: Dr Arvilla Meres Dr Marca Ancona Dr Brandon Melnick, NP Robbie Lis, [place] Mikki Santee Karle Plumber, PharmD   Please be sure to bring in all your medications bottles to every appointment.

## 2021-04-10 NOTE — Progress Notes (Signed)
Cardiology: Dr. Aundra Dubin PCP: Patient, No Pcp Per (Inactive)  40 y.o. with history of ETOH abuse and smoking, nonobstructive CAD, and primarily nonischemic cardiomyopathy presents for followup of CHF.  Patient has a strong family history of CAD, both his mother and father had CAD diagnosed in their 24s.  He moved to  about a year ago from [place] for his job.  Initially, he was here without his family and started drinking quite heavily. He also has smoked chronically.   He was admitted in 12/20 with pleuritic chest pain and abdominal pain.  Amylase and lipase were high suggesting acute pancreatitis, due to ETOH but also with possible contribution from very high triglycerides (4000 range initially).  Echo was done, showing EF 25%. Patient was initially admitted to [facility], but at this point was transferred to [facility].  CMRI showed EF 10% with no LGE.  Cath showed low filling pressures and age-advanced CAD but no tight blockages. No arrhythmias were noted while in the hospital.  He was started slowly on cardiac meds (due to soft BP) and discharged home.   He was re-admitted in 2/21 with sudden onset pleuritic chest pain concerning for acute pericarditis.  Echo showed pericardial tamponade and he had pericardiocentesis.  Echo after pericardial drain was removed showed EF 20-25%, global hypokinesis, normal RV.  He was started on high dose ASA and colchicine.    Repeat echo in 3/21 showed EF up to 30-35%, normal RV, no pericardial effusion.  Echo in 6/21 showed EF 60-65%, RV normal.  Echo done in 6/22 showed EF 45-50%, diffuse mild hypokinesis, normal RV.    Weight is down 6 lbs since last appointment.  He gets winded with heavy exertion but denies dyspnea walking on flat ground or up a flight of stairs.  No chest pain.  No orthopnea/PND.   ECG (personally reviewed): NSR, nonspecific T wave flattening.    Labs (12/20): K 4.1, creatinine 0.9, LDL 260, TGs 625, HDL 14 Labs (2/21): ANA negative, K 4.3, creatinine 0.72  => 0.87, LDL 48, HDL 29, TGs 112, ESR 70, CRP 28 => 23, digoxin 0.3 Labs (6/21): K 3.6, creatinine 0.86 Labs (12/21): K 4.6, creatinine 0.81 Labs (4/22): LDL 59, TGs 848, K 4, creatinine 0.83, AST 47, ALT 32 Labs (6/22): LDL 41, TGs 178, K 3.5, creatinine 0.87, LFTs normal  PMH: 1. ETOH abuse 2. Smoking 3. H/o ETOH pancreatitis in 12/20. 4. 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  5. Chronic systolic CHF: Primarily nonischemic cardiomyopathy.  Possibly related to ETOH abuse.  - Echo (12/20): Severely dilated LV with EF 25-30%.  - Cardiac MRI (12/20): EF 10%, moderate LV dilation, mild-moderately decreased RV systolic function, no late gadolinium enhancement.  - RHC/LHC (12/20): mean RA 1, PA 14/2, mean PCWP 2, CI 2.2.  Distal LAD narrows without severe discrete stenosis, 40% ostial D1, diffuse luminal irregularities.   - Echo (2/21): EF 20-25%, global HK, RV normal.  - Echo (3/21): EF 30-35%, global HK, normal RV, no pericardial effusion.  - Echo (6/21): EF 60-65%, normal RV - Echo (6/22): EF 45-50%, diffuse hypokinesis, normal RV.  6. CAD: Coronary angiography in 12/20 showed that distal LAD narrows without severe discrete stenosis, 40% ostial D1, diffuse luminal irregularities.  7. Acute pericarditis with pericardial tamponade: 2/21, had pericardiocentesis.   SH: Married, works for a [employer], originally from [place], history of ETOH abuse and smoking.   FH: Father with CAD at age 25, mother with stents in her 38s.   ROS: All systems reviewed  and negative except as per HPI.   Current Outpatient Medications  Medication Sig Dispense Refill   carvedilol (COREG) 12.5 MG tablet Take 1.5 tablets (18.75 mg total) by mouth 2 (two) times daily. 270 tablet 3   CVS ASPIRIN ADULT LOW DOSE 81 MG chewable tablet Chew 81 mg by mouth daily.      dapagliflozin propanediol (FARXIGA) 10 MG TABS tablet Take 1 tablet (10 mg total) by mouth daily before breakfast. 30 tablet 6   ENTRESTO 97-103 MG TAKE 1  TABLET BY MOUTH TWICE A DAY 60 tablet 6   fenofibrate 160 MG tablet Take 1 tablet (160 mg total) by mouth daily. 90 tablet 3   folic acid (FOLVITE) 268 MCG tablet Take 800 mcg by mouth daily.      Multiple Vitamin (MULTIVITAMIN WITH MINERALS) TABS tablet Take 1 tablet by mouth daily.     omega-3 acid ethyl esters (LOVAZA) 1 g capsule Take 2 capsules (2 g total) by mouth 2 (two) times daily. 360 capsule 3   rosuvastatin (CRESTOR) 40 MG tablet Take 1 tablet (40 mg total) by mouth daily at 6 PM. 90 tablet 2   spironolactone (ALDACTONE) 25 MG tablet TAKE 1 TABLET BY MOUTH EVERYDAY AT BEDTIME 30 tablet 6   thiamine 250 MG tablet Take 125 mg by mouth daily.     No current facility-administered medications for this encounter.   BP 96/68   Pulse 78   Ht _0  (1.778 m)   Wt 81.7 kg (180 lb 3.2 oz)   SpO2 98%   BMI 25.86 kg/m  General: NAD Neck: No JVD, no thyromegaly or thyroid nodule.  Lungs: Clear to auscultation bilaterally with normal respiratory effort. CV: Nondisplaced PMI.  Heart regular S1/S2, no S3/S4, no murmur.  No peripheral edema.  No carotid bruit.  Normal pedal pulses.  Abdomen: Soft, nontender, no hepatosplenomegaly, no distention.  Skin: Intact without lesions or rashes.  Neurologic: Alert and oriented x 3.  Psych: Normal affect. Extremities: No clubbing or cyanosis.  HEENT: Normal.   Assessment/Plan: 1. Chronic systolic CHF: Echo at [facility] in 12/20 with EF 25-30%, normal RV.  Cardiolite showed EF 15% with fixed defect.  LHC/RHC 12/20 showed nonobstructive coronary disease and low filling pressures, CI 2.2.  Had pleuritic chest pain possibly consistent with perimyocarditis however hs troponin not elevated and cRMI in 12/20 w/o evidence of myocarditis or infiltrative disease. Cardiomyopathy may also be related to heavy ETOH use.  Episode of acute pericarditis in 2/21 also concerns me that the initial lesion may have been perimyocarditis.  Echo in 6/21 with EF back up to  60-65% in setting of abstinence from ETOH.  Echo in 6/22 with EF 45-50%, earlier this year was drinking more ETOH but has now quit drinking totally again.  NYHA class I symptoms now.  Not volume overloaded on exam.  - Continue Entresto 97/103 bid.  BMET today.  - He does not need a diuretic.  - Continue spironolactone 25 daily.   - Continue Coreg 18.75 mg bid, SBP in 90s so will not increase. - Add Farxiga 10 mg daily.  BMET 10 days.    - Now out of ICD range.  - Imperative that he stay off ETOH.  - Repeat echo at followup in 4 months.  2. 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  FH of CAD, markedly high lipids, smoker.   - Will need ASA 81 long-term.  - Continue Crestor 40 mg daily, good lipids in 6/22.  3. Hyperlipidemia:  Suspect familial dyslipidemia. Acceptable lipids in 6/22.  - Continue Crestor.  - Continue fenofibrate and Lovaza.  4. Acute pancreatitis: ?Due to ETOH vs elevated triglycerides vs both.  Resolved.   5 Smoking: He has quit.  6. ETOH abuse:  Imperative to stay off ETOH as I suspect that it was a major contributor to his cardiomyopathy.   7. Acute pericarditis: May have had pericarditis in 12/20, then with acute pericarditis in 2/21.  Chest pain has resolved.  No pericardial effusion on 3/21 echo.  8. HTN: BP on the low side now.   Followup in 4 months with echo.      Loralie Champagne 04/10/2021

## 2021-05-05 ENCOUNTER — Other Ambulatory Visit (HOSPITAL_COMMUNITY): Payer: Self-pay | Admitting: Internal Medicine

## 2021-05-25 ENCOUNTER — Other Ambulatory Visit (HOSPITAL_COMMUNITY): Payer: Self-pay | Admitting: Cardiology

## 2021-06-14 ENCOUNTER — Other Ambulatory Visit (HOSPITAL_COMMUNITY): Payer: Self-pay | Admitting: Cardiology

## 2021-09-19 ENCOUNTER — Other Ambulatory Visit (HOSPITAL_COMMUNITY): Payer: Self-pay | Admitting: Cardiology

## 2021-11-11 ENCOUNTER — Other Ambulatory Visit (HOSPITAL_COMMUNITY): Payer: Self-pay | Admitting: Cardiology

## 2021-12-04 ENCOUNTER — Other Ambulatory Visit (HOSPITAL_COMMUNITY): Payer: Self-pay | Admitting: Cardiology

## 2021-12-16 ENCOUNTER — Other Ambulatory Visit (HOSPITAL_COMMUNITY): Payer: Self-pay | Admitting: Cardiology

## 2022-01-08 ENCOUNTER — Other Ambulatory Visit (HOSPITAL_COMMUNITY): Payer: Self-pay | Admitting: Cardiology

## 2022-01-16 ENCOUNTER — Other Ambulatory Visit (HOSPITAL_COMMUNITY): Payer: Self-pay | Admitting: Cardiology

## 2022-02-13 ENCOUNTER — Other Ambulatory Visit (HOSPITAL_COMMUNITY): Payer: Self-pay | Admitting: Cardiology

## 2022-03-07 ENCOUNTER — Other Ambulatory Visit (HOSPITAL_COMMUNITY): Payer: Self-pay | Admitting: *Deleted

## 2022-03-07 DIAGNOSIS — I5022 Chronic systolic (congestive) heart failure: Secondary | ICD-10-CM

## 2022-03-13 ENCOUNTER — Other Ambulatory Visit (HOSPITAL_COMMUNITY): Payer: Self-pay | Admitting: Cardiology

## 2022-04-01 IMAGING — US US ABDOMEN LIMITED
1 series · 14 of 25 positions shown · non-contrast
Comparison: CT chest 11/09/2019.

CLINICAL DATA: Right upper quadrant pain.

EXAM:
ULTRASOUND ABDOMEN LIMITED RIGHT UPPER QUADRANT

[Series 1: us abdomen limited ruq · 14 of 46 slices shown]
[im 1/46]
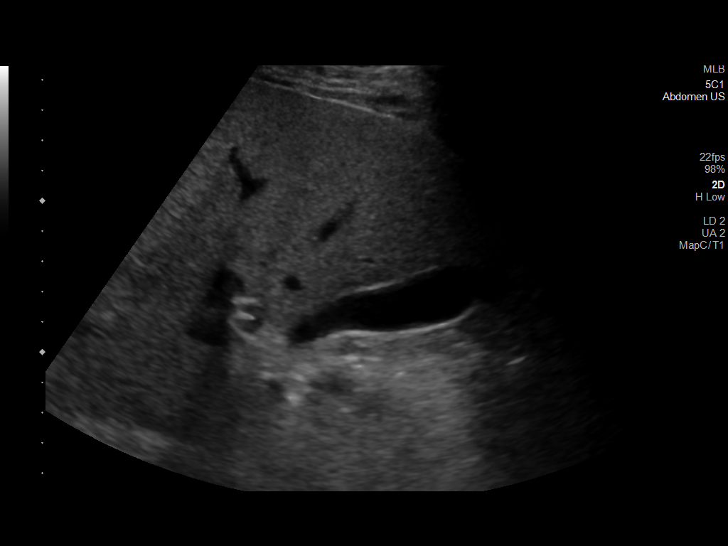
[im 4/46]
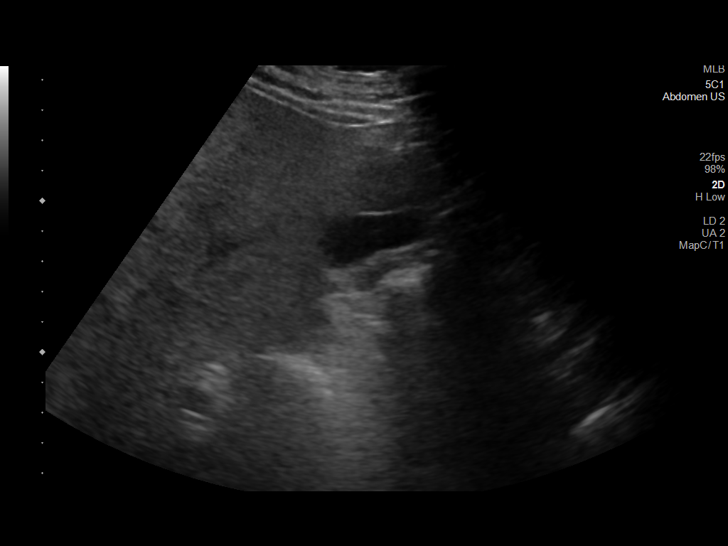
[im 8/46]
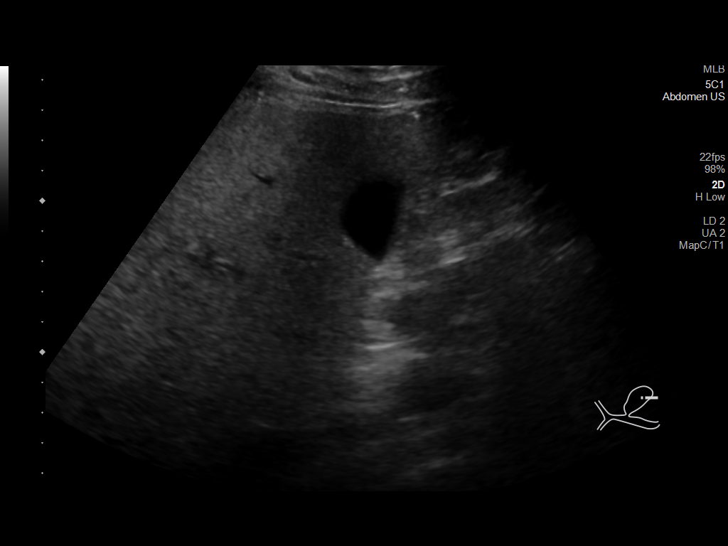
[im 12/46]
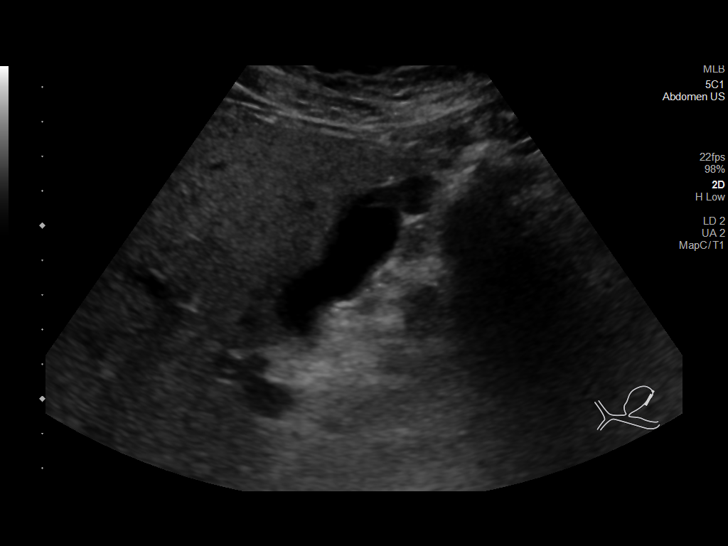
[im 16/46]
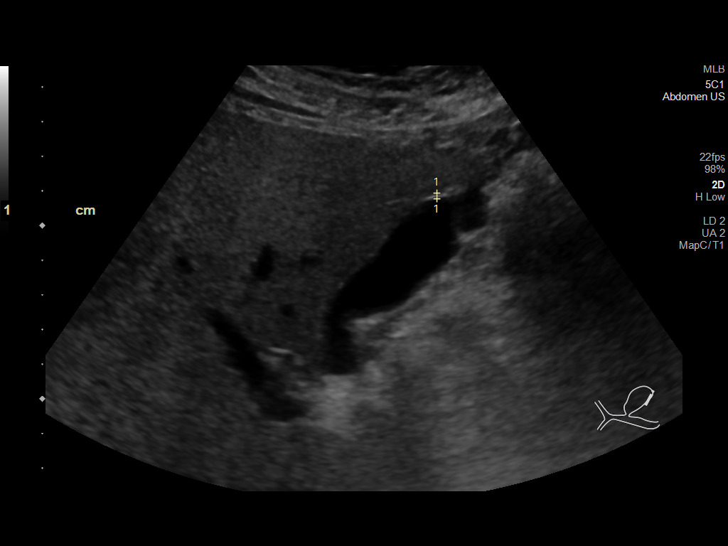
[im 17/46]
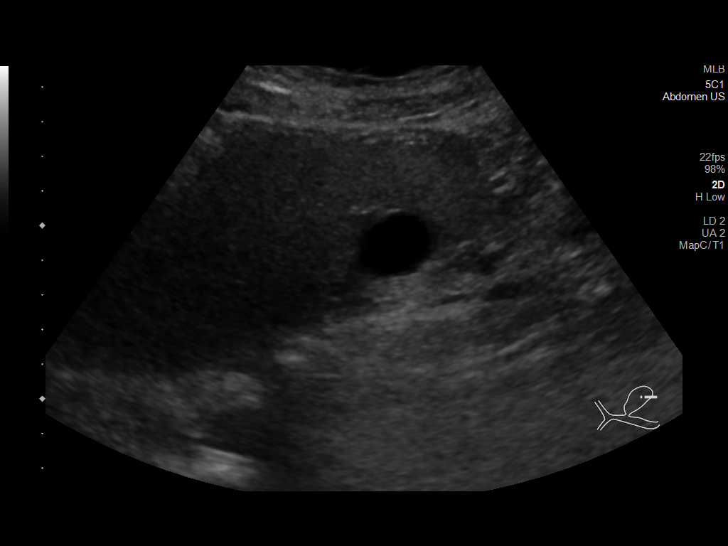
[im 21/46]
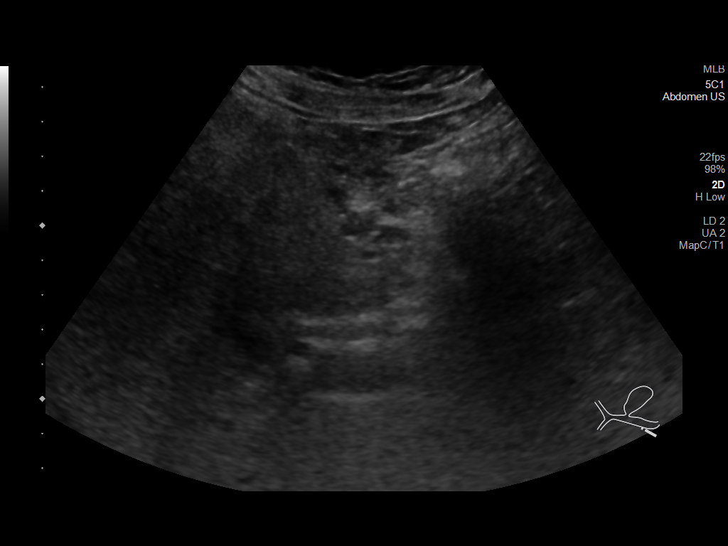
[im 25/46]
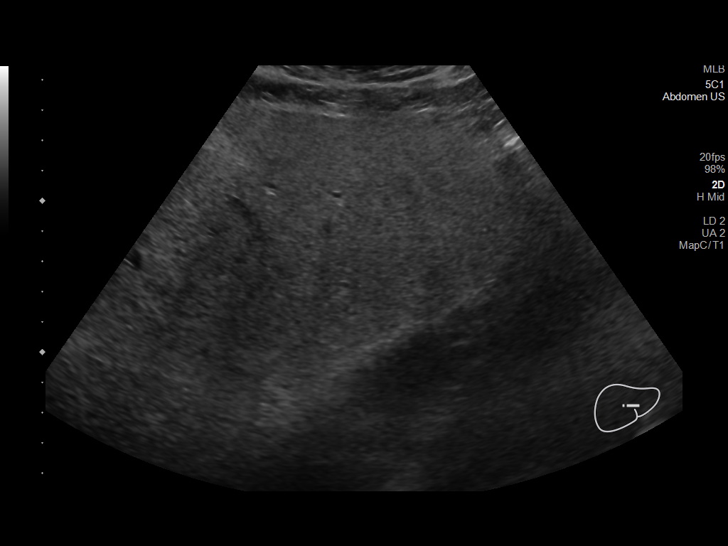
[im 29/46]
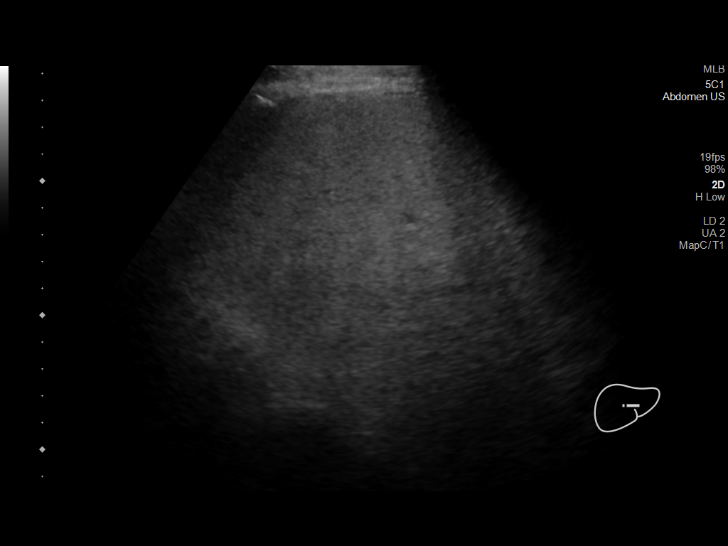
[im 31/46]
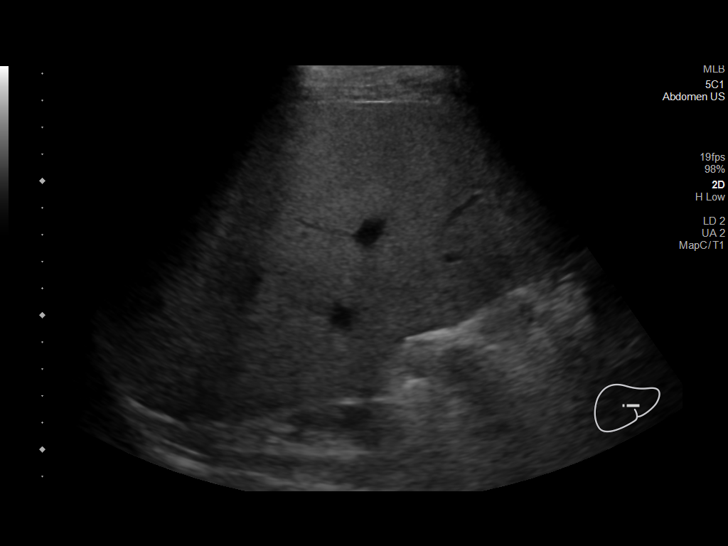
[im 34/46]
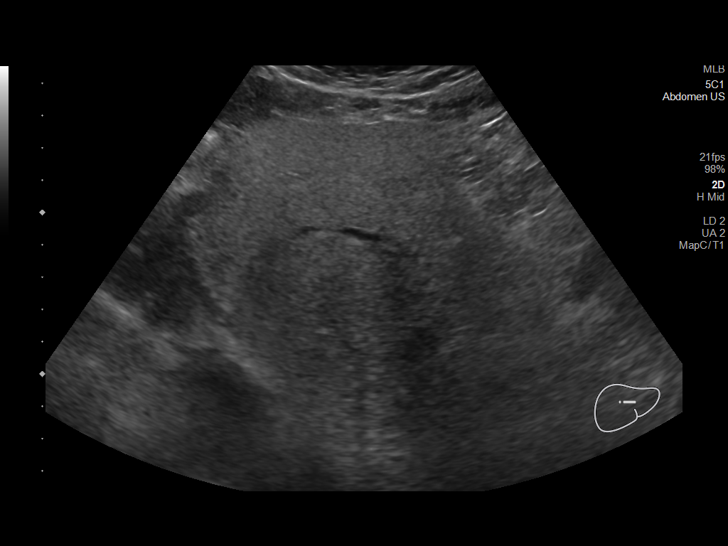
[im 38/46]
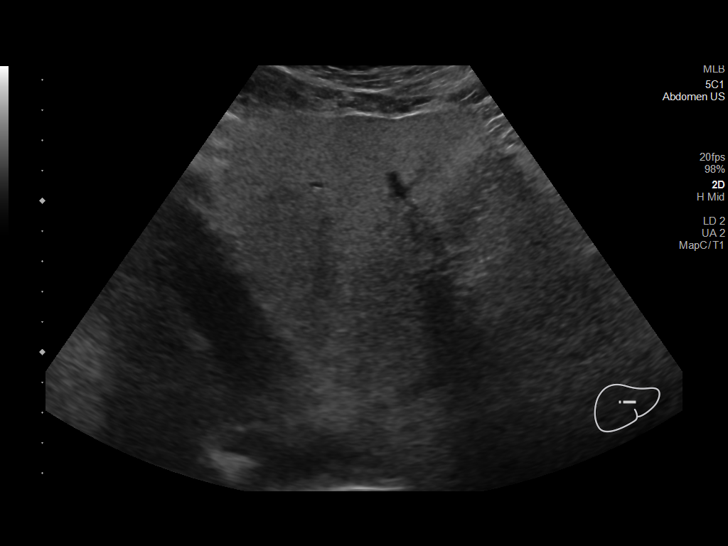
[im 42/46]
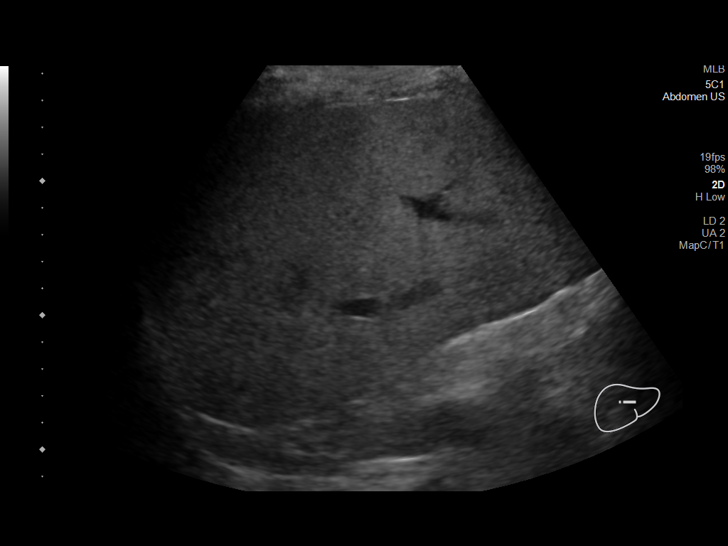
[im 46/46]
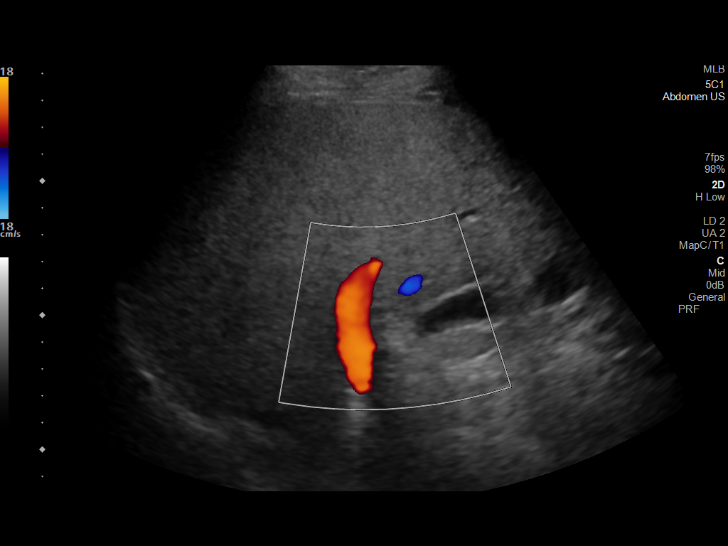

[14 of 25 positions shown; findings below may reference images not displayed]

FINDINGS: Gallbladder:

No gallstones or wall thickening visualized. No sonographic Murphy
sign noted by sonographer.

Common bile duct:

Diameter: 3.1 mm

Liver:

Heterogenous dense parenchymal pattern suggesting fatty infiltration
or hepatocellular disease. Echotexture of the liver makes evaluation
for focal hepatic abnormalities difficult. No definite focal hepatic
abnormality identified. Portal vein is patent on color Doppler
imaging with normal direction of blood flow towards the liver.

Other: None.
IMPRESSION: 1.  No gallstones or biliary distention.

2. Heterogeneous dense hepatic parenchymal pattern suggesting fatty
infiltration or hepatocellular disease.

## 2022-04-27 ENCOUNTER — Encounter (HOSPITAL_COMMUNITY): Payer: Self-pay | Admitting: Cardiology

## 2022-04-27 ENCOUNTER — Ambulatory Visit (HOSPITAL_COMMUNITY)
Admission: RE | Admit: 2022-04-27 | Discharge: 2022-04-27 | Disposition: A | Discharge status: Home / Self Care | Payer: Commercial Managed Care - PPO | Source: Ambulatory Visit | Attending: Cardiology | Admitting: Cardiology

## 2022-04-27 ENCOUNTER — Ambulatory Visit (HOSPITAL_BASED_OUTPATIENT_CLINIC_OR_DEPARTMENT_OTHER)
Admission: RE | Admit: 2022-04-27 | Discharge: 2022-04-27 | Disposition: A | Discharge status: Home / Self Care | Payer: Commercial Managed Care - PPO | Source: Ambulatory Visit | Attending: Cardiology | Admitting: Cardiology

## 2022-04-27 VITALS — BP 118/78 | HR 79 | Wt 179.2 lb

## 2022-04-27 DIAGNOSIS — F1010 Alcohol abuse, uncomplicated: Secondary | ICD-10-CM | POA: Insufficient documentation

## 2022-04-27 DIAGNOSIS — I2510 Atherosclerotic heart disease of native coronary artery without angina pectoris: Secondary | ICD-10-CM | POA: Diagnosis not present

## 2022-04-27 DIAGNOSIS — I5022 Chronic systolic (congestive) heart failure: Secondary | ICD-10-CM | POA: Insufficient documentation

## 2022-04-27 DIAGNOSIS — E785 Hyperlipidemia, unspecified: Secondary | ICD-10-CM | POA: Insufficient documentation

## 2022-04-27 DIAGNOSIS — F17200 Nicotine dependence, unspecified, uncomplicated: Secondary | ICD-10-CM | POA: Diagnosis not present

## 2022-04-27 DIAGNOSIS — I309 Acute pericarditis, unspecified: Secondary | ICD-10-CM | POA: Diagnosis not present

## 2022-04-27 DIAGNOSIS — R9431 Abnormal electrocardiogram [ECG] [EKG]: Secondary | ICD-10-CM | POA: Insufficient documentation

## 2022-04-27 DIAGNOSIS — I428 Other cardiomyopathies: Secondary | ICD-10-CM | POA: Diagnosis not present

## 2022-04-27 DIAGNOSIS — Z79899 Other long term (current) drug therapy: Secondary | ICD-10-CM | POA: Insufficient documentation

## 2022-04-27 DIAGNOSIS — Z7984 Long term (current) use of oral hypoglycemic drugs: Secondary | ICD-10-CM | POA: Diagnosis not present

## 2022-04-27 DIAGNOSIS — K859 Acute pancreatitis without necrosis or infection, unspecified: Secondary | ICD-10-CM | POA: Insufficient documentation

## 2022-04-27 DIAGNOSIS — I519 Heart disease, unspecified: Secondary | ICD-10-CM | POA: Diagnosis not present

## 2022-04-27 DIAGNOSIS — Z8249 Family history of ischemic heart disease and other diseases of the circulatory system: Secondary | ICD-10-CM | POA: Diagnosis not present

## 2022-04-27 LAB — ECHOCARDIOGRAM COMPLETE
AR max vel: 2.81 cm2
AV Area VTI: 2.58 cm2
AV Area mean vel: 2.82 cm2
AV Mean grad: 2 mmHg
AV Peak grad: 4 mmHg
Ao pk vel: 1 m/s
Area-P 1/2: 4.89 cm2
Calc EF: 53.7 %
S' Lateral: 3.1 cm
Single Plane A2C EF: 50.4 %
Single Plane A4C EF: 55.7 %

## 2022-04-27 LAB — CBC
HCT: 47.3 % (ref 39.0–52.0)
Hemoglobin: 16.4 g/dL (ref 13.0–17.0)
MCH: 30.7 pg (ref 26.0–34.0)
MCHC: 34.7 g/dL (ref 30.0–36.0)
MCV: 88.6 fL (ref 80.0–100.0)
Platelets: 273 10*3/uL (ref 150–400)
RBC: 5.34 MIL/uL (ref 4.22–5.81)
RDW: 13.3 % (ref 11.5–15.5)
WBC: 7 10*3/uL (ref 4.0–10.5)
nRBC: 0 % (ref 0.0–0.2)

## 2022-04-27 LAB — LIPID PANEL
Cholesterol: 206 mg/dL — ABNORMAL HIGH (ref 0–200)
HDL: 49 mg/dL (ref >40)
LDL Cholesterol: 134 mg/dL — ABNORMAL HIGH (ref 0–99)
Total CHOL/HDL Ratio: 4.2 RATIO
Triglycerides: 114 mg/dL (ref <150)
VLDL: 23 mg/dL (ref 0–40)

## 2022-04-27 LAB — BASIC METABOLIC PANEL
Anion gap: 13 (ref 5–15)
BUN: 11 mg/dL (ref 6–20)
CO2: 26 mmol/L (ref 22–32)
Calcium: 10 mg/dL (ref 8.9–10.3)
Chloride: 104 mmol/L (ref 98–111)
Creatinine, Ser: 0.9 mg/dL (ref 0.61–1.24)
GFR, Estimated: >60 mL/min (ref >60)
Glucose, Bld: 100 mg/dL — ABNORMAL HIGH (ref 70–99)
Potassium: 4.9 mmol/L (ref 3.5–5.1)
Sodium: 143 mmol/L (ref 135–145)

## 2022-04-27 LAB — BRAIN NATRIURETIC PEPTIDE: B Natriuretic Peptide: 9.5 pg/mL (ref 0.0–100.0)

## 2022-04-27 NOTE — Progress Notes (Signed)
  Echocardiogram 2D Echocardiogram has been performed.  Merrie Roof F 04/27/2022, 12:09 PM

## 2022-04-27 NOTE — Patient Instructions (Signed)
There has been no changes to your medications.  Labs done today, your results will be available in MyChart, we will contact you for abnormal readings.  Your physician has recommended that you have a cardiopulmonary stress test (CPX). CPX testing is a non-invasive measurement of heart and lung function. It replaces a traditional treadmill stress test. This type of test provides a tremendous amount of information that relates not only to your present condition but also for future outcomes. This test combines measurements of you ventilation, respiratory gas exchange in the lungs, electrocardiogram (EKG), blood pressure and physical response before, during, and following an exercise protocol.  Your physician recommends that you schedule a follow-up appointment in: 6 months ( April 2024)  ** please call the office in February to arrange your follow up appointment **  If you have any questions or concerns before your next appointment please send [place] a message through [place] or call our office at [phone].    TO LEAVE A MESSAGE FOR THE NURSE SELECT OPTION 2, PLEASE LEAVE A MESSAGE INCLUDING: YOUR NAME DATE OF BIRTH CALL BACK NUMBER REASON FOR CALL**this is important as we prioritize the call backs  YOU WILL RECEIVE A CALL BACK THE SAME DAY AS LONG AS YOU CALL BEFORE 4:00 PM  At the [facility], you and your health needs are our priority. As part of our continuing mission to provide you with exceptional heart care, we have created designated Provider Care Teams. These Care Teams include your primary Cardiologist (physician) and Advanced Practice Providers (APPs- Physician Assistants and Nurse Practitioners) who all work together to provide you with the care you need, when you need it.   You may see any of the following providers on your designated Care Team at your next follow up: Dr Glori Bickers Dr Loralie Champagne Dr. Roxana Hires, NP Lyda Jester,  [place] [facility] [place], [place] Forestine Na, NP Audry Riles, PharmD   Please be sure to bring in all your medications bottles to every appointment.

## 2022-04-29 NOTE — Progress Notes (Signed)
Cardiology: Dr. Aundra Dubin PCP: Patient, No Pcp Per  41 y.o. with history of ETOH abuse and smoking, nonobstructive CAD, and primarily nonischemic cardiomyopathy presents for followup of CHF.  Patient has a strong family history of CAD, both his mother and father had CAD diagnosed in their 48s.  He moved to [place] about a year ago from [place] for his job.  Initially, he was here without his family and started drinking quite heavily. He also has smoked chronically.   He was admitted in 12/20 with pleuritic chest pain and abdominal pain.  Amylase and lipase were high suggesting acute pancreatitis, due to ETOH but also with possible contribution from very high triglycerides (4000 range initially).  Echo was done, showing EF 25%. Patient was initially admitted to [facility], but at this point was transferred to [facility].  CMRI showed EF 10% with no LGE.  Cath showed low filling pressures and age-advanced CAD but no tight blockages. No arrhythmias were noted while in the hospital.  He was started slowly on cardiac meds (due to soft BP) and discharged home.   He was re-admitted in 2/21 with sudden onset pleuritic chest pain concerning for acute pericarditis.  Echo showed pericardial tamponade and he had pericardiocentesis.  Echo after pericardial drain was removed showed EF 20-25%, global hypokinesis, normal RV.  He was started on high dose ASA and colchicine.    Repeat echo in 3/21 showed EF up to 30-35%, normal RV, no pericardial effusion.  Echo in 6/21 showed EF 60-65%, RV normal.  Echo done in 6/22 showed EF 45-50%, diffuse mild hypokinesis, normal RV.  Echo was done today and reviewed, EF 45% with GLS -9.6% (abnormal), mildly decreased RV systolic function.   Weight is down 1 lb since last appointment.  He drinks ETOH very rarely now.  He had a period of time this summer when he would get lightheaded when outside in the heat and felt more short of breath than usual.  However, with cooler weather, this has improved. Now, no  dyspnea walking on flat ground or up a flight of stairs.  He still gets short of breath and tired with heavy exercise.  No further lightheadedness.  No chest pain.   ECG (personally reviewed): NSR, normal    Labs (12/20): K 4.1, creatinine 0.9, LDL 260, TGs 625, HDL 14 Labs (2/21): ANA negative, K 4.3, creatinine 0.72 => 0.87, LDL 48, HDL 29, TGs 112, ESR 70, CRP 28 => 23, digoxin 0.3 Labs (6/21): K 3.6, creatinine 0.86 Labs (12/21): K 4.6, creatinine 0.81 Labs (4/22): LDL 59, TGs 848, K 4, creatinine 0.83, AST 47, ALT 32 Labs (6/22): LDL 41, TGs 178, K 3.5, creatinine 0.87, LFTs normal Labs (10/22): K 4.2, creatinine 1.2  PMH: 1. ETOH abuse 2. Smoking 3. H/o ETOH pancreatitis in 12/20. 4. 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  5. Chronic systolic CHF: Primarily nonischemic cardiomyopathy.  Possibly related to ETOH abuse.  - Echo (12/20): Severely dilated LV with EF 25-30%.  - Cardiac MRI (12/20): EF 10%, moderate LV dilation, mild-moderately decreased RV systolic function, no late gadolinium enhancement.  - RHC/LHC (12/20): mean RA 1, PA 14/2, mean PCWP 2, CI 2.2.  Distal LAD narrows without severe discrete stenosis, 40% ostial D1, diffuse luminal irregularities.   - Echo (2/21): EF 20-25%, global HK, RV normal.  - Echo (3/21): EF 30-35%, global HK, normal RV, no pericardial effusion.  - Echo (6/21): EF 60-65%, normal RV - Echo (6/22): EF 45-50%, diffuse hypokinesis, normal RV.  - Echo (10/23):  EF 45% with GLS -9.6% (abnormal), mildly decreased RV systolic function.  6. CAD: Coronary angiography in 12/20 showed that distal LAD narrows without severe discrete stenosis, 40% ostial D1, diffuse luminal irregularities.  7. Acute pericarditis with pericardial tamponade: 2/21, had pericardiocentesis.   SH: Married, works for a [employer], originally from [place], history of ETOH abuse and smoking.   FH: Father with CAD at age 24, mother with stents in her 33s.   ROS: All systems reviewed and  negative except as per HPI.   Current Outpatient Medications  Medication Sig Dispense Refill   carvedilol (COREG) 12.5 MG tablet TAKE 1 TABLET BY MOUTH TWICE A DAY 60 tablet 11   [employer] ASPIRIN ADULT LOW DOSE 81 MG chewable tablet Chew 81 mg by mouth daily.      FARXIGA 10 MG TABS tablet TAKE 1 TABLET BY MOUTH DAILY BEFORE BREAKFAST. 30 tablet 6   fenofibrate 160 MG tablet Take 1 tablet (160 mg total) by mouth daily. NEEDS FOLLOW UP APPOINTMENT FOR ANYMORE REFILLS 90 tablet 0   folic acid (FOLVITE) 782 MCG tablet Take 800 mcg by mouth daily.      Multiple Vitamin (MULTIVITAMIN WITH MINERALS) TABS tablet Take 1 tablet by mouth daily.     omega-3 acid ethyl esters (LOVAZA) 1 g capsule TAKE TWO CAPSULES BY MOUTH TWICE A DAY 120 capsule 11   rosuvastatin (CRESTOR) 40 MG tablet TAKE 1 TABLET BY MOUTH DAILY AT 6 PM. 30 tablet 11   sacubitril-valsartan (ENTRESTO) 97-103 MG Take 1 tablet by mouth 2 (two) times daily. 60 tablet 11   spironolactone (ALDACTONE) 25 MG tablet Take 1 tablet (25 mg total) by mouth daily. NEED FOLLOW UP APPOINTMENT FOR MORE REFILLS 30 tablet 6   thiamine 250 MG tablet Take 125 mg by mouth daily.     No current facility-administered medications for this encounter.   BP 118/78   Pulse 79   Wt 81.3 kg (179 lb 3.2 oz)   SpO2 98%   BMI 25.71 kg/m  General: NAD Neck: No JVD, no thyromegaly or thyroid nodule.  Lungs: Clear to auscultation bilaterally with normal respiratory effort. CV: Nondisplaced PMI.  Heart regular S1/S2, no S3/S4, no murmur.  No peripheral edema.  No carotid bruit.  Normal pedal pulses.  Abdomen: Soft, nontender, no hepatosplenomegaly, no distention.  Skin: Intact without lesions or rashes.  Neurologic: Alert and oriented x 3.  Psych: Normal affect. Extremities: No clubbing or cyanosis.  HEENT: Normal.   Assessment/Plan: 1. Chronic systolic CHF: Echo at [facility] in 12/20 with EF 25-30%, normal RV.  Cardiolite showed EF 15% with fixed defect.  LHC/RHC  12/20 showed nonobstructive coronary disease and low filling pressures, CI 2.2.  Had pleuritic chest pain possibly consistent with perimyocarditis, however hs troponin not elevated and cRMI in 12/20 w/o evidence of myocarditis or infiltrative disease. Cardiomyopathy may also be related to heavy ETOH use.  Episode of acute pericarditis in 2/21 concerns me that the initial lesion may have been perimyocarditis.  Echo in 6/21 with EF back up to 60-65% in setting of abstinence from ETOH.  Echo in 6/22 with EF 45-50%, echo done today and reviewed with EF 45% with GLS -9.6% (abnormal), mildly decreased RV systolic function.  He is still rarely drinking ETOH though not totally abstinent.  NYHA class II symptoms now.  Not volume overloaded on exam.  - Continue Entresto 97/103 bid.  BMET today.  - He does not need a diuretic.  - Continue spironolactone 25  daily.   - Continue Coreg 18.75 mg bid, Will not increase with some degree of exercise intolerance.  - Continue Farxiga 10 mg daily.   - Now out of ICD range.  - Imperative that he stay off ETOH. - He needs to keep himself well-hydrated with [place] and Delene Loll.  - Given exertional symptoms at times, I will arrange for CPX to assess functional capacity. He needs to exercise regularly.  - BNP today.  2. 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  FH of CAD, markedly high lipids, smoker.   - Will need ASA 81 long-term.  - Continue Crestor 40 mg daily, check lipids today.  3. Hyperlipidemia: Suspect familial dyslipidemia. Acceptable lipids in 6/22.  - Continue Crestor.  - Continue fenofibrate and Lovaza.  4. Acute pancreatitis: ?Due to ETOH vs elevated triglycerides vs both.  Resolved.   5 Smoking: He has quit.  6. ETOH abuse:  Imperative to stay off ETOH as I suspect that it was a major contributor to his cardiomyopathy.   7. Acute pericarditis: May have had pericarditis in 12/20, then with acute pericarditis in 2/21.  Chest pain has resolved.  No  pericardial effusion on today's echo.  8. HTN: BP not elevated.   Followup in 6 months if CPX is not markedly abnormal.     Loralie Champagne 04/29/2022

## 2022-04-30 ENCOUNTER — Telehealth (HOSPITAL_COMMUNITY): Payer: Self-pay | Admitting: *Deleted

## 2022-04-30 DIAGNOSIS — I5022 Chronic systolic (congestive) heart failure: Secondary | ICD-10-CM

## 2022-04-30 DIAGNOSIS — E785 Hyperlipidemia, unspecified: Secondary | ICD-10-CM

## 2022-04-30 NOTE — Telephone Encounter (Signed)
Called patient per Dr. Aundra Dubin based on LDH level last Friday. Confirmed with patient he is taking Crestor 40 mg daily. Referral sent to [facility]. Advised patient he should be hearing from them for his initial appointment, if he doesn't hear from them by end of week, he will call CHF clinic for advisement.

## 2022-05-05 ENCOUNTER — Other Ambulatory Visit (HOSPITAL_COMMUNITY): Payer: Self-pay | Admitting: Cardiology

## 2022-05-10 ENCOUNTER — Encounter: Payer: Self-pay | Admitting: *Deleted

## 2022-05-14 ENCOUNTER — Ambulatory Visit (HOSPITAL_COMMUNITY): Payer: Commercial Managed Care - PPO | Attending: Internal Medicine

## 2022-05-14 DIAGNOSIS — I519 Heart disease, unspecified: Secondary | ICD-10-CM

## 2022-05-14 DIAGNOSIS — I5020 Unspecified systolic (congestive) heart failure: Secondary | ICD-10-CM

## 2022-05-15 ENCOUNTER — Telehealth (HOSPITAL_COMMUNITY): Payer: Self-pay

## 2022-05-15 NOTE — Telephone Encounter (Signed)
Patient aware and agreeable with instructions to start exercise program.

## 2022-05-17 ENCOUNTER — Other Ambulatory Visit (HOSPITAL_COMMUNITY): Payer: Self-pay | Admitting: Internal Medicine

## 2022-06-30 ENCOUNTER — Other Ambulatory Visit (HOSPITAL_COMMUNITY): Payer: Self-pay | Admitting: Cardiology

## 2022-07-26 ENCOUNTER — Telehealth (HOSPITAL_COMMUNITY): Payer: Self-pay

## 2022-07-26 ENCOUNTER — Other Ambulatory Visit (HOSPITAL_COMMUNITY): Payer: Self-pay

## 2022-07-26 NOTE — Telephone Encounter (Signed)
Advanced Heart Failure Patient Advocate Encounter  Prior authorization is required for [place]. [place] submitted and APPROVED on 07/26/22.  Key [id number] Effective: 07/26/22 - 07/25/25  Clista Bernhardt, CPhT Rx Patient Advocate Phone: [phone]

## 2022-09-18 ENCOUNTER — Other Ambulatory Visit (HOSPITAL_COMMUNITY): Payer: Self-pay | Admitting: Cardiology

## 2022-09-23 ENCOUNTER — Other Ambulatory Visit (HOSPITAL_COMMUNITY): Payer: Self-pay | Admitting: Cardiology

## 2022-10-05 ENCOUNTER — Other Ambulatory Visit (HOSPITAL_COMMUNITY): Payer: Self-pay

## 2022-10-05 ENCOUNTER — Other Ambulatory Visit (HOSPITAL_COMMUNITY): Payer: Self-pay | Admitting: Cardiology

## 2022-10-05 MED ORDER — OMEGA-3-ACID ETHYL ESTERS 1 G PO CAPS: 2.0000 | ORAL_CAPSULE | Freq: Two times a day (BID) | ORAL | 0 refills | Status: DC

## 2023-01-06 ENCOUNTER — Other Ambulatory Visit (HOSPITAL_COMMUNITY): Payer: Self-pay | Admitting: Cardiology

## 2023-03-16 ENCOUNTER — Other Ambulatory Visit (HOSPITAL_COMMUNITY): Payer: Self-pay | Admitting: Cardiology

## 2023-04-15 ENCOUNTER — Other Ambulatory Visit (HOSPITAL_COMMUNITY): Payer: Self-pay | Admitting: Cardiology

## 2023-06-27 ENCOUNTER — Other Ambulatory Visit (HOSPITAL_COMMUNITY): Payer: Self-pay | Admitting: Cardiology

## 2023-06-30 ENCOUNTER — Other Ambulatory Visit (HOSPITAL_COMMUNITY): Payer: Self-pay | Admitting: Cardiology

## 2023-07-22 ENCOUNTER — Other Ambulatory Visit (HOSPITAL_COMMUNITY): Payer: Self-pay | Admitting: Cardiology

## 2023-08-05 ENCOUNTER — Other Ambulatory Visit (HOSPITAL_COMMUNITY): Payer: Self-pay | Admitting: Cardiology

## 2023-09-19 ENCOUNTER — Other Ambulatory Visit (HOSPITAL_COMMUNITY): Payer: Self-pay | Admitting: Cardiology

## 2023-10-03 ENCOUNTER — Other Ambulatory Visit (HOSPITAL_COMMUNITY): Payer: Self-pay | Admitting: Cardiology

## 2023-10-16 ENCOUNTER — Other Ambulatory Visit (HOSPITAL_COMMUNITY): Payer: Self-pay | Admitting: Cardiology
